# Patient Record
Sex: Female | Born: 1990
Health system: Southern US, Community
[De-identification: ages and names within clinical notes are randomized; demographics above are authoritative.]

## PROBLEM LIST (undated history)

## (undated) DIAGNOSIS — N6009 Solitary cyst of unspecified breast: Secondary | ICD-10-CM

## (undated) DIAGNOSIS — K259 Gastric ulcer, unspecified as acute or chronic, without hemorrhage or perforation: Secondary | ICD-10-CM

## (undated) DIAGNOSIS — J40 Bronchitis, not specified as acute or chronic: Secondary | ICD-10-CM

## (undated) DIAGNOSIS — N83209 Unspecified ovarian cyst, unspecified side: Secondary | ICD-10-CM

## (undated) DIAGNOSIS — B029 Zoster without complications: Secondary | ICD-10-CM

## (undated) DIAGNOSIS — D219 Benign neoplasm of connective and other soft tissue, unspecified: Secondary | ICD-10-CM

## (undated) DIAGNOSIS — N946 Dysmenorrhea, unspecified: Secondary | ICD-10-CM

## (undated) HISTORY — PX: RECTAL POLYPECTOMY: SHX2309

## (undated) HISTORY — DX: Dysmenorrhea, unspecified: N94.6

## (undated) HISTORY — PX: BREAST LUMPECTOMY: SHX2

## (undated) HISTORY — DX: Benign neoplasm of connective and other soft tissue, unspecified: D21.9

---
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
## 2010-03-01 ENCOUNTER — Emergency Department (HOSPITAL_BASED_OUTPATIENT_CLINIC_OR_DEPARTMENT_OTHER): Admission: EM | Admit: 2010-03-01 | Discharge: 2010-03-01 | Payer: Self-pay | Admitting: Emergency Medicine

## 2010-06-09 ENCOUNTER — Emergency Department (HOSPITAL_COMMUNITY)
Admission: EM | Admit: 2010-06-09 | Discharge: 2010-06-09 | Disposition: A | Discharge status: Home / Self Care | Payer: Self-pay | Attending: Emergency Medicine | Admitting: Emergency Medicine

## 2010-06-09 ENCOUNTER — Emergency Department (HOSPITAL_COMMUNITY): Payer: Self-pay

## 2010-06-09 DIAGNOSIS — N949 Unspecified condition associated with female genital organs and menstrual cycle: Secondary | ICD-10-CM | POA: Insufficient documentation

## 2010-06-09 DIAGNOSIS — N912 Amenorrhea, unspecified: Secondary | ICD-10-CM | POA: Insufficient documentation

## 2010-06-09 LAB — WET PREP, GENITAL
Trich, Wet Prep: NONE SEEN
Yeast Wet Prep HPF POC: NONE SEEN

## 2010-06-09 LAB — BASIC METABOLIC PANEL
Chloride: 105 mEq/L (ref 96–112)
GFR calc Af Amer: >60 mL/min (ref >60)
Potassium: 3.7 mEq/L (ref 3.5–5.1)

## 2010-06-09 LAB — DIFFERENTIAL
Basophils Relative: 0 % (ref 0–1)
Eosinophils Absolute: 0.2 10*3/uL (ref 0.0–0.7)
Lymphs Abs: 2.7 10*3/uL (ref 0.7–4.0)
Neutro Abs: 5.2 10*3/uL (ref 1.7–7.7)
Neutrophils Relative %: 61 % (ref 43–77)

## 2010-06-09 LAB — CBC
Platelets: 245 10*3/uL (ref 150–400)
RBC: 4.18 MIL/uL (ref 3.87–5.11)
WBC: 8.6 10*3/uL (ref 4.0–10.5)

## 2010-06-09 LAB — URINALYSIS, ROUTINE W REFLEX MICROSCOPIC
Bilirubin Urine: NEGATIVE
Hgb urine dipstick: NEGATIVE
Ketones, ur: NEGATIVE mg/dL
Protein, ur: NEGATIVE mg/dL
Specific Gravity, Urine: 1.02 (ref 1.005–1.030)
Urine Glucose, Fasting: NEGATIVE mg/dL
Urobilinogen, UA: 0.2 mg/dL (ref 0.0–1.0)
pH: 6.5 (ref 5.0–8.0)

## 2010-06-09 LAB — PREGNANCY, URINE: Preg Test, Ur: NEGATIVE

## 2010-06-09 LAB — LIPASE, BLOOD: Lipase: 23 U/L (ref 11–59)

## 2010-08-11 LAB — URINALYSIS, ROUTINE W REFLEX MICROSCOPIC
Bilirubin Urine: NEGATIVE
Glucose, UA: NEGATIVE mg/dL
Ketones, ur: NEGATIVE mg/dL
Nitrite: NEGATIVE
Protein, ur: 100 mg/dL — AB
Specific Gravity, Urine: 1.038 — ABNORMAL HIGH (ref 1.005–1.030)
Urobilinogen, UA: 1 mg/dL (ref 0.0–1.0)
pH: 6 (ref 5.0–8.0)

## 2010-08-11 LAB — URINE MICROSCOPIC-ADD ON

## 2010-08-11 LAB — PREGNANCY, URINE: Preg Test, Ur: NEGATIVE

## 2010-12-06 ENCOUNTER — Emergency Department (HOSPITAL_BASED_OUTPATIENT_CLINIC_OR_DEPARTMENT_OTHER)
Admission: EM | Admit: 2010-12-06 | Discharge: 2010-12-06 | Disposition: A | Discharge status: Home / Self Care | Payer: Self-pay | Attending: Emergency Medicine | Admitting: Emergency Medicine

## 2010-12-06 ENCOUNTER — Encounter: Payer: Self-pay | Admitting: Emergency Medicine

## 2010-12-06 ENCOUNTER — Emergency Department (INDEPENDENT_AMBULATORY_CARE_PROVIDER_SITE_OTHER): Payer: Self-pay

## 2010-12-06 DIAGNOSIS — S62609A Fracture of unspecified phalanx of unspecified finger, initial encounter for closed fracture: Secondary | ICD-10-CM | POA: Insufficient documentation

## 2010-12-06 DIAGNOSIS — X58XXXA Exposure to other specified factors, initial encounter: Secondary | ICD-10-CM

## 2010-12-06 DIAGNOSIS — IMO0002 Reserved for concepts with insufficient information to code with codable children: Secondary | ICD-10-CM

## 2010-12-06 MED ORDER — HYDROCODONE-ACETAMINOPHEN 5-325 MG PO TABS
2.0000 | ORAL_TABLET | Freq: Once | ORAL | Status: AC
  Administered 2010-12-06: 2 via ORAL
  Filled 2010-12-06: qty 2

## 2010-12-06 MED ORDER — HYDROCODONE-ACETAMINOPHEN 5-325 MG PO TABS: 2.0000 | ORAL_TABLET | ORAL | Status: AC | PRN

## 2010-12-06 NOTE — ED Notes (Signed)
Right middle finger injury occurred today.  Difficulty with movement, some swelling and pain at area.  Pulses present

## 2010-12-06 NOTE — ED Provider Notes (Signed)
Evaluation and management procedures were performed by the PA/NP under my supervision/collaboration.   Dione Booze, MD 12/06/10 [phone]
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
## 2010-12-23 ENCOUNTER — Encounter (HOSPITAL_BASED_OUTPATIENT_CLINIC_OR_DEPARTMENT_OTHER): Payer: Self-pay | Admitting: *Deleted

## 2010-12-23 ENCOUNTER — Emergency Department (HOSPITAL_BASED_OUTPATIENT_CLINIC_OR_DEPARTMENT_OTHER)
Admission: EM | Admit: 2010-12-23 | Discharge: 2010-12-23 | Disposition: A | Discharge status: Home / Self Care | Payer: Self-pay | Attending: Emergency Medicine | Admitting: Emergency Medicine

## 2010-12-23 DIAGNOSIS — B373 Candidiasis of vulva and vagina: Secondary | ICD-10-CM | POA: Insufficient documentation

## 2010-12-23 DIAGNOSIS — N760 Acute vaginitis: Secondary | ICD-10-CM | POA: Insufficient documentation

## 2010-12-23 DIAGNOSIS — F17200 Nicotine dependence, unspecified, uncomplicated: Secondary | ICD-10-CM | POA: Insufficient documentation

## 2010-12-23 DIAGNOSIS — A499 Bacterial infection, unspecified: Secondary | ICD-10-CM | POA: Insufficient documentation

## 2010-12-23 DIAGNOSIS — B3731 Acute candidiasis of vulva and vagina: Secondary | ICD-10-CM | POA: Insufficient documentation

## 2010-12-23 DIAGNOSIS — B9689 Other specified bacterial agents as the cause of diseases classified elsewhere: Secondary | ICD-10-CM | POA: Insufficient documentation

## 2010-12-23 LAB — URINALYSIS, ROUTINE W REFLEX MICROSCOPIC
Bilirubin Urine: NEGATIVE
Glucose, UA: NEGATIVE mg/dL
Hgb urine dipstick: NEGATIVE
Ketones, ur: NEGATIVE mg/dL
Nitrite: NEGATIVE
Protein, ur: NEGATIVE mg/dL
Specific Gravity, Urine: 1.029 (ref 1.005–1.030)
Urobilinogen, UA: 1 mg/dL (ref 0.0–1.0)
pH: 8 (ref 5.0–8.0)

## 2010-12-23 LAB — WET PREP, GENITAL: Trich, Wet Prep: NONE SEEN

## 2010-12-23 LAB — URINE MICROSCOPIC-ADD ON

## 2010-12-23 LAB — PREGNANCY, URINE: Preg Test, Ur: NEGATIVE

## 2010-12-23 MED ORDER — METRONIDAZOLE 500 MG PO TABS: 500.0000 mg | ORAL_TABLET | Freq: Two times a day (BID) | ORAL | Status: AC

## 2010-12-23 MED ORDER — FLUCONAZOLE 200 MG PO TABS: 200.0000 mg | ORAL_TABLET | Freq: Every day | ORAL | Status: AC

## 2010-12-23 NOTE — ED Notes (Signed)
Pt c/o white vaginal discharge and swelling x 1 week

## 2010-12-23 NOTE — ED Provider Notes (Signed)
History     CSN: [id number] Arrival date & time: 12/23/2010  1:27 PM  Chief Complaint  Patient presents with  . Vaginal Discharge   Patient is a 20 y.o. female presenting with vaginal discharge.  Vaginal Discharge This is a new problem. The current episode started 1 to 4 weeks ago. The problem occurs constantly. The problem has been gradually worsening. Associated symptoms include abdominal pain. Pertinent negatives include no fatigue, myalgias, nausea, urinary symptoms, vomiting or weakness. The symptoms are aggravated by nothing. She has tried nothing for the symptoms.  Pt complains of a thick white vaginal discharge for the past 2 weeks.    History reviewed. No pertinent past medical history.  Past Surgical History  Procedure Date  . Breast lumpectomy     History reviewed. No pertinent family history.  History  Substance Use Topics  . Smoking status: Current Some Day Smoker    Types: Cigarettes  . Smokeless tobacco: Not on file  . Alcohol Use: No    OB History    Grav Para Term Preterm Abortions TAB SAB Ect Mult Living                  Review of Systems  Constitutional: Negative for fatigue.  Gastrointestinal: Positive for abdominal pain. Negative for nausea and vomiting.  Genitourinary: Positive for vaginal discharge.  Musculoskeletal: Negative for myalgias.  Neurological: Negative for weakness.  All other systems reviewed and are negative.    Physical Exam  BP 123/64  Pulse 86  Temp(Src) 98.1 F (36.7 C) (Oral)  Resp 16  Wt 240 lb (108.863 kg)  SpO2 100%  LMP 11/10/2010  Physical Exam  Nursing note and vitals reviewed. Constitutional: She is oriented to person, place, and time. She appears well-developed and well-nourished.  HENT:  Head: Normocephalic.  Eyes: Pupils are equal, round, and reactive to light.  Neck: Normal range of motion.  Cardiovascular: Normal rate.   Pulmonary/Chest: Effort normal.  Abdominal: Soft.  Genitourinary:       Thick  white vaginal discharge,    Musculoskeletal: Normal range of motion.  Neurological: She is alert and oriented to person, place, and time.  Skin: Skin is warm.  Psychiatric: She has a normal mood and affect.    ED Course  Procedures  MDM Wet prep shows yeast and clue cells,  I will treat with flagyl and diflucan.      Langston Masker, [place] 12/23/10 1450

## 2010-12-23 NOTE — ED Provider Notes (Signed)
Evaluation and management procedures were performed by the mid-level provider (PA/NP/CNM) under my supervision/collaboration. I was present and available during the ED course. Darin Redmann Y.   Gavin Pound. Charlisa Cham, MD 12/23/10 1455

## 2011-01-27 LAB — RAPID STREP SCREEN (MED CTR MEBANE ONLY): Streptococcus, Group A Screen (Direct): NEGATIVE

## 2011-02-25 ENCOUNTER — Emergency Department (HOSPITAL_BASED_OUTPATIENT_CLINIC_OR_DEPARTMENT_OTHER)
Admission: EM | Admit: 2011-02-25 | Discharge: 2011-02-25 | Disposition: A | Discharge status: Home / Self Care | Payer: Self-pay | Attending: Emergency Medicine | Admitting: Emergency Medicine

## 2011-02-25 ENCOUNTER — Encounter (HOSPITAL_BASED_OUTPATIENT_CLINIC_OR_DEPARTMENT_OTHER): Payer: Self-pay | Admitting: *Deleted

## 2011-02-25 ENCOUNTER — Emergency Department (INDEPENDENT_AMBULATORY_CARE_PROVIDER_SITE_OTHER): Payer: Self-pay

## 2011-02-25 DIAGNOSIS — B9789 Other viral agents as the cause of diseases classified elsewhere: Secondary | ICD-10-CM | POA: Insufficient documentation

## 2011-02-25 DIAGNOSIS — B349 Viral infection, unspecified: Secondary | ICD-10-CM

## 2011-02-25 DIAGNOSIS — F17200 Nicotine dependence, unspecified, uncomplicated: Secondary | ICD-10-CM | POA: Insufficient documentation

## 2011-02-25 DIAGNOSIS — R05 Cough: Secondary | ICD-10-CM

## 2011-02-25 MED ORDER — HYDROCOD POLST-CHLORPHEN POLST 10-8 MG/5ML PO LQCR: 5.0000 mL | Freq: Two times a day (BID) | ORAL | Status: DC | PRN

## 2011-02-25 NOTE — ED Provider Notes (Signed)
History     CSN: [id number] Arrival date & time: 02/25/2011  3:05 PM   First MD Initiated Contact with Patient 02/25/11 1606      Chief Complaint  Patient presents with  . URI    (Consider location/radiation/quality/duration/timing/severity/associated sxs/prior treatment) Patient is a 20 y.o. female presenting with URI. The history is provided by the patient. No language interpreter was used.  URI The primary symptoms include sore throat, cough and myalgias. Primary symptoms do not include fever, headaches, nausea or vomiting. The current episode started yesterday. This is a new problem. The problem has not changed since onset. Symptoms associated with the illness include chills and congestion.    History reviewed. No pertinent past medical history.  Past Surgical History  Procedure Date  . Breast lumpectomy     History reviewed. No pertinent family history.  History  Substance Use Topics  . Smoking status: Current Some Day Smoker    Types: Cigarettes  . Smokeless tobacco: Not on file  . Alcohol Use: No    OB History    Grav Para Term Preterm Abortions TAB SAB Ect Mult Living                  Review of Systems  Constitutional: Positive for chills. Negative for fever.  HENT: Positive for congestion and sore throat.   Respiratory: Positive for cough.   Gastrointestinal: Negative for nausea and vomiting.  Musculoskeletal: Positive for myalgias.  Neurological: Negative for headaches.  All other systems reviewed and are negative.    Allergies  Review of patient's allergies indicates no known allergies.  Home Medications  No current outpatient prescriptions on file.  BP 118/70  Pulse 89  Temp(Src) 98.7 F (37.1 C) (Oral)  Resp 18  Ht 5\' 7"  (1.702 m)  Wt 220 lb (99.791 kg)  BMI 34.46 kg/m2  SpO2 98%  LMP 02/25/2011  Physical Exam  Nursing note and vitals reviewed. Constitutional: She is oriented to person, place, and time. She appears well-developed  and well-nourished.  HENT:  Right Ear: External ear normal.  Left Ear: External ear normal.  Nose: Rhinorrhea present.  Mouth/Throat: Posterior oropharyngeal erythema present.  Cardiovascular: Normal rate and regular rhythm.   Pulmonary/Chest: Effort normal. She has rales.  Musculoskeletal: Normal range of motion.  Neurological: She is alert and oriented to person, place, and time.  Skin: Skin is warm and dry.    ED Course  Procedures (including critical care time)  Labs Reviewed - No data to display Dg Chest 2 View  02/25/2011  *RADIOLOGY REPORT*  Clinical Data: Cough  CHEST - 2 VIEW  Comparison: 09/25/2008  Findings: The heart size and mediastinal contours are within normal limits.  Both lungs are clear.  The visualized skeletal structures are unremarkable.  IMPRESSION: No active disease.  Original Report Authenticated By: Rosealee Albee, M.D.     1. Viral illness       MDM  No acute finding noted on x-ray:will treat symptomatically  Medical screening examination/treatment/procedure(s) were performed by non-physician practitioner and as supervising physician I was immediately available for consultation/collaboration. Osvaldo Human, M.D.       Teressa Lower, NP 02/25/11 1709  Teressa Lower, NP 02/25/11 1709  Carleene Cooper III, MD 02/25/11 [phone]

## 2011-02-25 NOTE — ED Notes (Signed)
Pt c/o cold like symptoms x 1 day

## 2011-05-19 ENCOUNTER — Encounter (HOSPITAL_COMMUNITY): Payer: Self-pay | Admitting: General Practice

## 2011-05-19 ENCOUNTER — Encounter (HOSPITAL_BASED_OUTPATIENT_CLINIC_OR_DEPARTMENT_OTHER): Payer: Self-pay

## 2011-05-19 ENCOUNTER — Emergency Department (HOSPITAL_COMMUNITY)
Admission: EM | Admit: 2011-05-19 | Discharge: 2011-05-19 | Disposition: A | Discharge status: Home / Self Care | Payer: Self-pay | Attending: Emergency Medicine | Admitting: Emergency Medicine

## 2011-05-19 ENCOUNTER — Emergency Department (HOSPITAL_BASED_OUTPATIENT_CLINIC_OR_DEPARTMENT_OTHER)
Admission: EM | Admit: 2011-05-19 | Discharge: 2011-05-19 | Disposition: A | Discharge status: Home / Self Care | Payer: Self-pay | Attending: Emergency Medicine | Admitting: Emergency Medicine

## 2011-05-19 DIAGNOSIS — M549 Dorsalgia, unspecified: Secondary | ICD-10-CM | POA: Insufficient documentation

## 2011-05-19 DIAGNOSIS — R109 Unspecified abdominal pain: Secondary | ICD-10-CM | POA: Insufficient documentation

## 2011-05-19 DIAGNOSIS — F17200 Nicotine dependence, unspecified, uncomplicated: Secondary | ICD-10-CM | POA: Insufficient documentation

## 2011-05-19 DIAGNOSIS — B029 Zoster without complications: Secondary | ICD-10-CM | POA: Insufficient documentation

## 2011-05-19 HISTORY — DX: Bronchitis, not specified as acute or chronic: J40

## 2011-05-19 HISTORY — DX: Unspecified ovarian cyst, unspecified side: N83.209

## 2011-05-19 LAB — URINALYSIS, ROUTINE W REFLEX MICROSCOPIC
Bilirubin Urine: NEGATIVE
Glucose, UA: NEGATIVE mg/dL
Specific Gravity, Urine: 1.025 (ref 1.005–1.030)
pH: 6.5 (ref 5.0–8.0)

## 2011-05-19 LAB — URINE MICROSCOPIC-ADD ON

## 2011-05-19 LAB — DIFFERENTIAL
Basophils Relative: 0 % (ref 0–1)
Eosinophils Absolute: 0.2 10*3/uL (ref 0.0–0.7)
Lymphocytes Relative: 30 % (ref 12–46)
Lymphs Abs: 3.1 10*3/uL (ref 0.7–4.0)
Monocytes Relative: 6 % (ref 3–12)
Neutrophils Relative %: 62 % (ref 43–77)

## 2011-05-19 LAB — CBC
Hemoglobin: 11.9 g/dL — ABNORMAL LOW (ref 12.0–15.0)
MCH: 28.8 pg (ref 26.0–34.0)
MCHC: 32.9 g/dL (ref 30.0–36.0)
Platelets: 249 10*3/uL (ref 150–400)
RBC: 4.13 MIL/uL (ref 3.87–5.11)
WBC: 10.1 10*3/uL (ref 4.0–10.5)

## 2011-05-19 MED ORDER — FENTANYL CITRATE 0.05 MG/ML IJ SOLN
50.0000 ug | Freq: Once | INTRAMUSCULAR | Status: AC
  Administered 2011-05-19: 50 ug via INTRAMUSCULAR
  Filled 2011-05-19: qty 2

## 2011-05-19 MED ORDER — ACYCLOVIR 400 MG PO TABS: 800.0000 mg | ORAL_TABLET | ORAL | Status: AC

## 2011-05-19 MED ORDER — KETOROLAC TROMETHAMINE 30 MG/ML IJ SOLN
60.0000 mg | Freq: Once | INTRAMUSCULAR | Status: AC
  Administered 2011-05-19: 60 mg via INTRAMUSCULAR
  Filled 2011-05-19: qty 2

## 2011-05-19 MED ORDER — OXYCODONE-ACETAMINOPHEN 7.5-325 MG PO TABS: 1.0000 | ORAL_TABLET | ORAL | Status: AC | PRN

## 2011-05-19 NOTE — ED Notes (Signed)
Pt's mother reports she was seen at [facility] today and dx with shingles. States they wanted her to stay in hospital but pt refused and now mother wants her seen again in case being admitted is definitely needed. States she did not have rx filled for acyclovir or percocet.

## 2011-05-19 NOTE — ED Notes (Signed)
Patient states that she developed shingles last Saturday. Today she saw a physician who wanted to admit her as an in-patient at another facility. Patient did not wish to be admitted and was sent home with prescriptions for pain and an anti-viral for shingles. Patient c/o increased pain and her mother took her to [facility] ER to be seen.

## 2011-05-19 NOTE — ED Provider Notes (Signed)
History     CSN: [id number]  Arrival date & time 05/19/11  4098   First MD Initiated Contact with Patient 05/19/11 [phone]      Chief Complaint  Patient presents with  . Abdominal Pain  . Back Pain  . Rash     HPI Pt reports lower abdominal pain radiating to lower back since Saturday. She reports a whitish vaginal d/c and pain with urination x 2-3 weeks.  Patient reports to three-day history of a rash which started on her low back area.  She says it's tender to touch the skin.  Patient denies any fever.  Past Medical History  Diagnosis Date  . Ovarian cyst     Past Surgical History  Procedure Date  . Breast lumpectomy     No family history on file.  History  Substance Use Topics  . Smoking status: Current Some Day Smoker    Types: Cigarettes  . Smokeless tobacco: Not on file  . Alcohol Use: Yes     occasional    OB History    Grav Para Term Preterm Abortions TAB SAB Ect Mult Living                  Review of Systems Remaining review of systems negative except as noted in history of present illness Allergies  Review of patient's allergies indicates no known allergies.  Home Medications   Current Outpatient Rx  Name Route Sig Dispense Refill  . ACYCLOVIR 400 MG PO TABS Oral Take 2 tablets (800 mg total) by mouth every 4 (four) hours while awake. 50 tablet 0  . HYDROCOD POLST-CPM POLST ER 10-8 MG/5ML PO LQCR Oral Take 5 mLs by mouth every 12 (twelve) hours as needed. 140 mL 0  . OXYCODONE-ACETAMINOPHEN 7.5-325 MG PO TABS Oral Take 1 tablet by mouth every 4 (four) hours as needed for pain. 30 tablet 0    BP 125/78  Pulse 82  Temp(Src) 98.2 F (36.8 C) (Oral)  Resp 16  Ht 5\' 5"  (1.651 m)  Wt 220 lb (99.791 kg)  BMI 36.61 kg/m2  SpO2 100%  LMP 04/23/2011  Physical Exam  Nursing note and vitals reviewed. Constitutional: She is oriented to person, place, and time. She appears well-developed and well-nourished. No distress.  HENT:  Head: Normocephalic  and atraumatic.  Eyes: Pupils are equal, round, and reactive to light.  Neck: Normal range of motion.  Cardiovascular: Normal rate and intact distal pulses.   Pulmonary/Chest: No respiratory distress.  Abdominal: Normal appearance. She exhibits no distension.  Musculoskeletal: Normal range of motion.  Neurological: She is alert and oriented to person, place, and time. No cranial nerve deficit.  Skin: Skin is warm and dry. Rash noted.     Psychiatric: She has a normal mood and affect. Her behavior is normal.    ED Course  Procedures (including critical care time)  Labs Reviewed  URINALYSIS, ROUTINE W REFLEX MICROSCOPIC - Abnormal; Notable for the following:    Hgb urine dipstick TRACE (*)    All other components within normal limits  CBC - Abnormal; Notable for the following:    Hemoglobin 11.9 (*)    All other components within normal limits  URINE MICROSCOPIC-ADD ON - Abnormal; Notable for the following:    Squamous Epithelial / LPF FEW (*)    Bacteria, UA FEW (*)    All other components within normal limits  PREGNANCY, URINE  DIFFERENTIAL   No results found.   1. Herpes zoster  MDM         Nelia Shi, MD 05/19/11 1053

## 2011-05-19 NOTE — ED Provider Notes (Signed)
History     CSN: [id number]  Arrival date & time 05/19/11  1327   First MD Initiated Contact with Patient 05/19/11 1501      Chief Complaint  Patient presents with  . Herpes Zoster    (Consider location/radiation/quality/duration/timing/severity/associated sxs/prior treatment) HPI  Patient presents to ER complaining of a painful rash x 4 days. Patient was seen at [facility] ER earlier today and dx with herpes zoster and given a prescription for acyclovir and narcotic pain medication but returns to [facility] ER with her mother with concern of ongoing pain and question of whether or not she should be admitted to hospital bc of pain. Patient states pain is burning and radiating around mid back into right flank and around to mid abdomen and is constant. Patient has not filled prescriptions or taken anything for pain since being d/c from ER earlier today. Mother and patient deny any known active medical problems and the patient states she takes no medication on a regular basis. Patient states that over the last few days she has felt generally weak and light headed especially when she goes from sitting to standing. Denies known fevers, chills, HA, neck pain, visual changes, n/v/d.   Past Medical History  Diagnosis Date  . Ovarian cyst   . Bronchitis     Past Surgical History  Procedure Date  . Breast lumpectomy     No family history on file.  History  Substance Use Topics  . Smoking status: Current Some Day Smoker    Types: Cigarettes  . Smokeless tobacco: Not on file  . Alcohol Use: Yes     occasional    OB History    Grav Para Term Preterm Abortions TAB SAB Ect Mult Living                  Review of Systems  All other systems reviewed and are negative.    Allergies  Review of patient's allergies indicates no known allergies.  Home Medications   Current Outpatient Rx  Name Route Sig Dispense Refill  . IBUPROFEN 200 MG PO TABS Oral Take 800 mg by mouth every 6 (six)  hours as needed. pain    . ACYCLOVIR 400 MG PO TABS Oral Take 2 tablets (800 mg total) by mouth every 4 (four) hours while awake. 50 tablet 0  . OXYCODONE-ACETAMINOPHEN 7.5-325 MG PO TABS Oral Take 1 tablet by mouth every 4 (four) hours as needed for pain. 30 tablet 0    BP 116/77  Pulse 81  Temp(Src) 98.5 F (36.9 C) (Oral)  Resp 16  SpO2 99%  LMP 04/23/2011  Physical Exam  Nursing note and vitals reviewed. Constitutional: She is oriented to person, place, and time. She appears well-developed and well-nourished. No distress.  HENT:  Head: Normocephalic and atraumatic.  Eyes: Conjunctivae and EOM are normal. Pupils are equal, round, and reactive to light.  Neck: Normal range of motion. Neck supple.  Cardiovascular: Normal rate, regular rhythm, normal heart sounds and intact distal pulses.  Exam reveals no gallop and no friction rub.   No murmur heard. Pulmonary/Chest: Effort normal and breath sounds normal. No respiratory distress. She has no wheezes. She has no rales. She exhibits no tenderness.  Abdominal: Soft. Bowel sounds are normal. She exhibits no distension and no mass. There is no tenderness. There is no rebound and no guarding.  Musculoskeletal: Normal range of motion. She exhibits no edema and no tenderness.  Neurological: She is alert and oriented  to person, place, and time. No cranial nerve deficit.  Skin: Skin is warm and dry. Rash noted. She is not diaphoretic. No erythema.       Papular, vesicular rash left of midline of mid back radiating in linear fashion.   Psychiatric: She has a normal mood and affect.    ED Course  Procedures (including critical care time)  IM fentanyl and toradol.  Labs Reviewed - No data to display No results found.   1. Herpes zoster       MDM  Patient is afebrile and non toxic appearing and is immunocompetent and appropriate for out patient management of herpes zoster.   Patient states pain has improved after toradol and  fentanyl. No changes with orthostatics and ambulating without difficulty.        Jenness Corner, [place] 05/19/11 1622

## 2011-05-19 NOTE — ED Notes (Signed)
Pt reports lower abdominal pain radiating to lower back since Saturday. She reports a whitish vaginal d/c and pain with urination x 2-3 weeks.

## 2011-05-19 NOTE — ED Notes (Signed)
MD at bedside. 

## 2011-05-20 NOTE — ED Provider Notes (Signed)
Medical screening examination/treatment/procedure(s) were performed by non-physician practitioner and as supervising physician I was immediately available for consultation/collaboration.  Chrishonda Hesch, MD 05/20/11 0001 

## 2011-05-22 ENCOUNTER — Encounter (HOSPITAL_BASED_OUTPATIENT_CLINIC_OR_DEPARTMENT_OTHER): Payer: Self-pay | Admitting: *Deleted

## 2011-05-22 ENCOUNTER — Emergency Department (HOSPITAL_BASED_OUTPATIENT_CLINIC_OR_DEPARTMENT_OTHER)
Admission: EM | Admit: 2011-05-22 | Discharge: 2011-05-22 | Disposition: A | Discharge status: Home / Self Care | Payer: Self-pay | Attending: Emergency Medicine | Admitting: Emergency Medicine

## 2011-05-22 DIAGNOSIS — R209 Unspecified disturbances of skin sensation: Secondary | ICD-10-CM | POA: Insufficient documentation

## 2011-05-22 DIAGNOSIS — B029 Zoster without complications: Secondary | ICD-10-CM | POA: Insufficient documentation

## 2011-05-22 DIAGNOSIS — F17200 Nicotine dependence, unspecified, uncomplicated: Secondary | ICD-10-CM | POA: Insufficient documentation

## 2011-05-22 DIAGNOSIS — M549 Dorsalgia, unspecified: Secondary | ICD-10-CM | POA: Insufficient documentation

## 2011-05-22 MED ORDER — HYDROCODONE-IBUPROFEN 7.5-200 MG PO TABS: ORAL_TABLET | ORAL | Status: DC

## 2011-05-22 MED ORDER — ONDANSETRON HCL 4 MG/2ML IJ SOLN
4.0000 mg | Freq: Once | INTRAMUSCULAR | Status: AC
  Administered 2011-05-22: 4 mg via INTRAMUSCULAR
  Filled 2011-05-22: qty 2

## 2011-05-22 MED ORDER — HYDROMORPHONE HCL PF 2 MG/ML IJ SOLN
2.0000 mg | Freq: Once | INTRAMUSCULAR | Status: AC
  Administered 2011-05-22: 2 mg via INTRAMUSCULAR
  Filled 2011-05-22: qty 1

## 2011-05-22 MED ORDER — OXYCODONE-ACETAMINOPHEN 5-325 MG PO TABS: 2.0000 | ORAL_TABLET | Freq: Once | ORAL | Status: DC

## 2011-05-22 MED ORDER — HYDROCODONE-IBUPROFEN 7.5-200 MG PO TABS: 2.0000 | ORAL_TABLET | Freq: Four times a day (QID) | ORAL | Status: DC | PRN

## 2011-05-22 MED ORDER — PREDNISONE 10 MG PO TABS: ORAL_TABLET | ORAL | Status: DC

## 2011-05-22 MED ORDER — PREDNISONE 50 MG PO TABS: 60.0000 mg | ORAL_TABLET | Freq: Every day | ORAL | Status: DC

## 2011-05-22 NOTE — ED Provider Notes (Signed)
History     CSN: [id number]  Arrival date & time 05/22/11  2128   First MD Initiated Contact with Patient 05/22/11 2227      Chief Complaint  Patient presents with  . Herpes Zoster    (Consider location/radiation/quality/duration/timing/severity/associated sxs/prior treatment) Patient is a 21 y.o. female presenting with rash. The history is provided by the patient. No language interpreter was used.  Rash  This is a new problem. The current episode started more than 2 days ago. The problem has not changed since onset.Associated with: shingles. There has been no fever. The rash is present on the torso and back. The pain is at a severity of 8/10. The pain is severe. The pain has been constant since onset. Associated symptoms include blisters. She has tried a cold compress for the symptoms. The treatment provided no relief.  Pt reports she was diagnosed with shingles 3 days ago.  Pt complains of pain full body.  Pt reports she hurts all over.  No relief with percocet  Past Medical History  Diagnosis Date  . Ovarian cyst   . Bronchitis     Past Surgical History  Procedure Date  . Breast lumpectomy     History reviewed. No pertinent family history.  History  Substance Use Topics  . Smoking status: Current Some Day Smoker    Types: Cigarettes  . Smokeless tobacco: Not on file  . Alcohol Use: Yes     occasional    OB History    Grav Para Term Preterm Abortions TAB SAB Ect Mult Living                  Review of Systems  Musculoskeletal: Positive for back pain.  Skin: Positive for rash.  All other systems reviewed and are negative.    Allergies  Review of patient's allergies indicates no known allergies.  Home Medications   Current Outpatient Rx  Name Route Sig Dispense Refill  . ACYCLOVIR 400 MG PO TABS Oral Take 2 tablets (800 mg total) by mouth every 4 (four) hours while awake. 50 tablet 0  . OXYCODONE-ACETAMINOPHEN 7.5-325 MG PO TABS Oral Take 1 tablet by  mouth every 4 (four) hours as needed for pain. 30 tablet 0    BP 134/80  Pulse 80  Temp(Src) 98.6 F (37 C) (Oral)  Resp 20  Ht 5\' 6"  (1.676 m)  Wt 230 lb (104.327 kg)  BMI 37.12 kg/m2  SpO2 100%  LMP 04/23/2011  Physical Exam  Nursing note and vitals reviewed. Constitutional: She is oriented to person, place, and time. She appears well-developed and well-nourished.  HENT:  Head: Normocephalic and atraumatic.  Eyes: Pupils are equal, round, and reactive to light.  Neck: Normal range of motion.  Cardiovascular: Normal rate.   Pulmonary/Chest: Effort normal.  Abdominal: Soft.  Musculoskeletal: She exhibits tenderness.       Clearing rash left back,    Neurological: She is alert and oriented to person, place, and time.  Skin: Rash noted.  Psychiatric: She has a normal mood and affect.    ED Course  Procedures (including critical care time)  Labs Reviewed - No data to display No results found.   No diagnosis found.    MDM   Pt given shot of dilaudid and zofran.   Pt advised to see her Md for recheck next week       Langston Masker, [place] 05/22/11 2255

## 2011-05-22 NOTE — ED Notes (Signed)
Pt seen here and diagnosed with shingles on the 23rd. Placed on Acyclovir and pain meds without relief. Tearful at triage.

## 2011-05-22 NOTE — ED Provider Notes (Signed)
Medical screening examination/treatment/procedure(s) were performed by non-physician practitioner and as supervising physician I was immediately available for consultation/collaboration.   Forbes Cellar, MD 05/22/11 [phone]

## 2011-05-25 NOTE — ED Notes (Signed)
rec'd call from pt requesting work excuse to be extended through the rest of this week due to her continued pain. Spoke with K. [place], [place] who stated that pt is no longer contagious and is free to return to work. Informed pt that she would not be receiving another work excuse. Pt agreed with plan of care. Rec'd another call from pt's mother regarding a work note for her daughter. Informed mother that I could not discuss her daughter's care with her due to it being a HIPPA violation. Pt then got back on the phone requesting an extension of her work note. Reviewed the previous information with pt once again and instructed her to take ibuprofen for pain and to only take narcotics if she was staying at home. Pt agrees with plan of care but is not happy with the service here since we would not extend her work note for the rest of the week. Pt informed that she could follow up with a PMD for further concerns or return here to be re-evaluated.

## 2011-07-01 ENCOUNTER — Emergency Department (INDEPENDENT_AMBULATORY_CARE_PROVIDER_SITE_OTHER): Payer: Self-pay

## 2011-07-01 ENCOUNTER — Emergency Department (HOSPITAL_BASED_OUTPATIENT_CLINIC_OR_DEPARTMENT_OTHER)
Admission: EM | Admit: 2011-07-01 | Discharge: 2011-07-01 | Disposition: A | Discharge status: Home / Self Care | Payer: Self-pay | Attending: Emergency Medicine | Admitting: Emergency Medicine

## 2011-07-01 ENCOUNTER — Encounter (HOSPITAL_BASED_OUTPATIENT_CLINIC_OR_DEPARTMENT_OTHER): Payer: Self-pay | Admitting: *Deleted

## 2011-07-01 DIAGNOSIS — S0990XA Unspecified injury of head, initial encounter: Secondary | ICD-10-CM | POA: Insufficient documentation

## 2011-07-01 DIAGNOSIS — M542 Cervicalgia: Secondary | ICD-10-CM | POA: Insufficient documentation

## 2011-07-01 DIAGNOSIS — R51 Headache: Secondary | ICD-10-CM | POA: Insufficient documentation

## 2011-07-01 DIAGNOSIS — Z043 Encounter for examination and observation following other accident: Secondary | ICD-10-CM

## 2011-07-01 DIAGNOSIS — R079 Chest pain, unspecified: Secondary | ICD-10-CM | POA: Insufficient documentation

## 2011-07-01 DIAGNOSIS — S161XXA Strain of muscle, fascia and tendon at neck level, initial encounter: Secondary | ICD-10-CM

## 2011-07-01 DIAGNOSIS — S139XXA Sprain of joints and ligaments of unspecified parts of neck, initial encounter: Secondary | ICD-10-CM | POA: Insufficient documentation

## 2011-07-01 HISTORY — DX: Zoster without complications: B02.9

## 2011-07-01 MED ORDER — OXYCODONE-ACETAMINOPHEN 5-325 MG PO TABS
1.0000 | ORAL_TABLET | Freq: Once | ORAL | Status: AC
  Administered 2011-07-01: 1 via ORAL
  Filled 2011-07-01: qty 1

## 2011-07-01 NOTE — ED Notes (Signed)
No bruises on the head and no seatbelt markings on Pt. Chest.  Pt. Reports she just got over the shingles about a month ago.  Pt. Reports she is sore all over.
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
## 2012-03-07 ENCOUNTER — Encounter (HOSPITAL_COMMUNITY): Payer: Self-pay | Admitting: Family

## 2012-03-07 ENCOUNTER — Inpatient Hospital Stay (HOSPITAL_COMMUNITY)
Admission: AD | Admit: 2012-03-07 | Discharge: 2012-03-07 | Disposition: A | Discharge status: Home / Self Care | Payer: Self-pay | Source: Ambulatory Visit | Attending: Obstetrics & Gynecology | Admitting: Obstetrics & Gynecology

## 2012-03-07 ENCOUNTER — Inpatient Hospital Stay (HOSPITAL_COMMUNITY): Payer: Self-pay

## 2012-03-07 DIAGNOSIS — N926 Irregular menstruation, unspecified: Secondary | ICD-10-CM | POA: Insufficient documentation

## 2012-03-07 DIAGNOSIS — D259 Leiomyoma of uterus, unspecified: Secondary | ICD-10-CM

## 2012-03-07 DIAGNOSIS — N83209 Unspecified ovarian cyst, unspecified side: Secondary | ICD-10-CM | POA: Insufficient documentation

## 2012-03-07 DIAGNOSIS — N949 Unspecified condition associated with female genital organs and menstrual cycle: Secondary | ICD-10-CM | POA: Insufficient documentation

## 2012-03-07 DIAGNOSIS — N946 Dysmenorrhea, unspecified: Secondary | ICD-10-CM

## 2012-03-07 HISTORY — DX: Solitary cyst of unspecified breast: N60.09

## 2012-03-07 LAB — URINALYSIS, ROUTINE W REFLEX MICROSCOPIC
Ketones, ur: NEGATIVE mg/dL
Leukocytes, UA: NEGATIVE
Nitrite: NEGATIVE
Protein, ur: NEGATIVE mg/dL
Urobilinogen, UA: 0.2 mg/dL (ref 0.0–1.0)

## 2012-03-07 LAB — CBC WITH DIFFERENTIAL/PLATELET
Basophils Absolute: 0 10*3/uL (ref 0.0–0.1)
HCT: 35.6 % — ABNORMAL LOW (ref 36.0–46.0)
Hemoglobin: 11.8 g/dL — ABNORMAL LOW (ref 12.0–15.0)
Lymphocytes Relative: 32 % (ref 12–46)
Lymphs Abs: 3.4 10*3/uL (ref 0.7–4.0)
MCV: 88.1 fL (ref 78.0–100.0)
Monocytes Absolute: 0.6 10*3/uL (ref 0.1–1.0)
Neutro Abs: 6.5 10*3/uL (ref 1.7–7.7)
RBC: 4.04 MIL/uL (ref 3.87–5.11)
RDW: 13.1 % (ref 11.5–15.5)
WBC: 10.7 10*3/uL — ABNORMAL HIGH (ref 4.0–10.5)

## 2012-03-07 LAB — WET PREP, GENITAL: Yeast Wet Prep HPF POC: NONE SEEN

## 2012-03-07 LAB — POCT PREGNANCY, URINE: Preg Test, Ur: NEGATIVE

## 2012-03-07 MED ORDER — NAPROXEN SODIUM 550 MG PO TABS: 550.0000 mg | ORAL_TABLET | Freq: Two times a day (BID) | ORAL | Status: DC

## 2012-03-07 MED ORDER — KETOROLAC TROMETHAMINE 60 MG/2ML IM SOLN
60.0000 mg | Freq: Once | INTRAMUSCULAR | Status: AC
  Administered 2012-03-07: 60 mg via INTRAMUSCULAR
  Filled 2012-03-07: qty 2

## 2012-03-07 NOTE — MAU Note (Signed)
Also has tampon she can't get out, pt's mother thinks its r/t pt's pain.

## 2012-03-07 NOTE — MAU Provider Note (Signed)
History     CSN: [id number]  Arrival date and time: 03/07/12 1143   First Provider Initiated Contact with Patient 03/07/12 1310      No chief complaint on file.  HPI Laura Vargas is a 21 y.o. female who presents to MAU with abdominal pain. The pain started this morning. Is similar to pain with periods. Started period last night. Rates her pain as 10/10. Nothing makes the pain better. Sitting up straight makes it worse. Has not taken anything for pain. Has taken Aleve in the past for period pain and it helps but this pain seemed worse than usual. Associated symptoms include nausea and headache. Last pap smear 6 months ago and was abnormal but has not had follow up. No history of STI's. Sexually active, current partner x 1 month. Condoms for birth control. The history was provided by the patient.  OB History    Grav Para Term Preterm Abortions TAB SAB Ect Mult Living   0               Past Medical History  Diagnosis Date  . Ovarian cyst   . Bronchitis   . Shingles   . Breast cyst     Past Surgical History  Procedure Date  . Breast lumpectomy     No family history on file.  History  Substance Use Topics  . Smoking status: Current Some Day Smoker    Types: Cigarettes  . Smokeless tobacco: Not on file  . Alcohol Use: Yes     Comment: occasional    Allergies: No Known Allergies  Prescriptions prior to admission  Medication Sig Dispense Refill  . naproxen sodium (ANAPROX) 220 MG tablet Take 220 mg by mouth 2 (two) times daily with a meal. Pain/cramps        Review of Systems  Constitutional: Negative for fever, chills and weight loss.  HENT: Negative for ear pain, nosebleeds, congestion, sore throat and neck pain.   Eyes: Negative for blurred vision, double vision, photophobia and pain.  Respiratory: Negative for cough, shortness of breath and wheezing.   Cardiovascular: Negative for chest pain, palpitations and leg swelling.  Gastrointestinal: Positive for  nausea, abdominal pain and constipation. Negative for heartburn, vomiting and diarrhea.  Genitourinary: Positive for frequency. Negative for dysuria and urgency.  Musculoskeletal: Positive for back pain. Negative for myalgias.  Skin: Negative for itching and rash.  Neurological: Positive for headaches. Negative for dizziness, sensory change, speech change, seizures and weakness.  Endo/Heme/Allergies: Does not bruise/bleed easily.  Psychiatric/Behavioral: Negative for depression. The patient is not nervous/anxious and does not have insomnia.    Physical Exam   Blood pressure 119/54, pulse 86, temperature 97.7 F (36.5 C), temperature source Oral, resp. rate 20, height 5\' 6"  (1.676 m), weight 239 lb 8 oz (108.636 kg), last menstrual period 03/06/2012.  Physical Exam  Nursing note and vitals reviewed. Constitutional: She is oriented to person, place, and time. No distress.       obese  HENT:  Head: Normocephalic and atraumatic.  Eyes: EOM are normal.  Neck: Neck supple.  Cardiovascular: Normal rate.   Respiratory: Effort normal.  GI: Soft. There is tenderness in the right lower quadrant, suprapubic area and left lower quadrant. There is no rigidity, no rebound, no guarding and no CVA tenderness.  Genitourinary:       External genitalia without lesions. Moderate blood vaginal vault. Positive CMT, bilateral adnexal tenderness. Unable to palpate uterus due to patient habitus.   Musculoskeletal: Normal  range of motion.  Neurological: She is alert and oriented to person, place, and time.  Skin: Skin is warm and dry.  Psychiatric: She has a normal mood and affect. Her behavior is normal. Judgment and thought content normal.   Results for orders placed during the hospital encounter of 03/07/12 (from the past 24 hour(s))  URINALYSIS, ROUTINE W REFLEX MICROSCOPIC     Status: Abnormal   Collection Time   03/07/12 12:20 PM      Component Value Range   Color, Urine YELLOW  YELLOW   APPearance  HAZY (*) CLEAR   Specific Gravity, Urine >1.030 (*) 1.005 - 1.030   pH 5.5  5.0 - 8.0   Glucose, UA NEGATIVE  NEGATIVE mg/dL   Hgb urine dipstick LARGE (*) NEGATIVE   Bilirubin Urine NEGATIVE  NEGATIVE   Ketones, ur NEGATIVE  NEGATIVE mg/dL   Protein, ur NEGATIVE  NEGATIVE mg/dL   Urobilinogen, UA 0.2  0.0 - 1.0 mg/dL   Nitrite NEGATIVE  NEGATIVE   Leukocytes, UA NEGATIVE  NEGATIVE  URINE MICROSCOPIC-ADD ON     Status: Normal   Collection Time   03/07/12 12:20 PM      Component Value Range   Squamous Epithelial / LPF RARE  RARE   RBC / HPF 21-50  <3 RBC/hpf  POCT PREGNANCY, URINE     Status: Normal   Collection Time   03/07/12 12:30 PM      Component Value Range   Preg Test, Ur NEGATIVE  NEGATIVE  CBC WITH DIFFERENTIAL     Status: Abnormal   Collection Time   03/07/12  1:35 PM      Component Value Range   WBC 10.7 (*) 4.0 - 10.5 K/uL   RBC 4.04  3.87 - 5.11 MIL/uL   Hemoglobin 11.8 (*) 12.0 - 15.0 g/dL   HCT 16.1 (*) 09.6 - 04.5 %   MCV 88.1  78.0 - 100.0 fL   MCH 29.2  26.0 - 34.0 pg   MCHC 33.1  30.0 - 36.0 g/dL   RDW 40.9  81.1 - 91.4 %   Platelets 272  150 - 400 K/uL   Neutrophils Relative 61  43 - 77 %   Neutro Abs 6.5  1.7 - 7.7 K/uL   Lymphocytes Relative 32  12 - 46 %   Lymphs Abs 3.4  0.7 - 4.0 K/uL   Monocytes Relative 6  3 - 12 %   Monocytes Absolute 0.6  0.1 - 1.0 K/uL   Eosinophils Relative 2  0 - 5 %   Eosinophils Absolute 0.2  0.0 - 0.7 K/uL   Basophils Relative 0  0 - 1 %   Basophils Absolute 0.0  0.0 - 0.1 K/uL  WET PREP, GENITAL     Status: Abnormal   Collection Time   03/07/12  2:03 PM      Component Value Range   Yeast Wet Prep HPF POC NONE SEEN  NONE SEEN   Trich, Wet Prep NONE SEEN  NONE SEEN   Clue Cells Wet Prep HPF POC NONE SEEN  NONE SEEN   WBC, Wet Prep HPF POC MODERATE (*) NONE SEEN    MAU Course  Procedures US Transvaginal Non-ob  03/07/2012  *RADIOLOGY REPORT*  Clinical Data: Pelvic pain.  Vaginal bleeding.  LMP 03/06/2012.   TRANSABDOMINAL AND TRANSVAGINAL ULTRASOUND OF PELVIS  Technique:  Both transabdominal and transvaginal ultrasound examinations of the pelvis were performed.  Transabdominal technique was performed for global imaging of  the pelvis including uterus, ovaries, adnexal regions, and pelvic cul-de-sac.  It was necessary to proceed with endovaginal exam following the transabdominal exam to visualize the retroverted uterus and endometrium.  Comparison:  None.  Findings: Uterus:  7.3 x 3.8 x 4.2 cm.  Retroverted.  A small subserosal fibroid is seen in the uterine fundus measuring 1.2 cm in maximal diameter.  No other uterine masses are identified.  Endometrium: Double layer thickness measures 3 mm transvaginally. No focal lesion visualized.  Right ovary: 2.7 x 1.6 x 2.2 cm.  Normal appearance.  Left ovary: 3.1 x 1.9 x 1.8 cm.  Normal appearance.  Other Findings:  A small amount of simple free fluid in the cul-de- sac.  IMPRESSION:  1.  Retroverted uterus with 1.2 cm subserosal fibroid in fundus. 2.  Normal appearance of both ovaries.  No adnexal mass identified.   Original Report Authenticated By: Myles Rosenthal, M.D.    US Pelvis Complete  03/07/2012  *RADIOLOGY REPORT*  Clinical Data: Pelvic pain.  Vaginal bleeding.  LMP 03/06/2012.  TRANSABDOMINAL AND TRANSVAGINAL ULTRASOUND OF PELVIS  Technique:  Both transabdominal and transvaginal ultrasound examinations of the pelvis were performed.  Transabdominal technique was performed for global imaging of the pelvis including uterus, ovaries, adnexal regions, and pelvic cul-de-sac.  It was necessary to proceed with endovaginal exam following the transabdominal exam to visualize the retroverted uterus and endometrium.  Comparison:  None.  Findings: Uterus:  7.3 x 3.8 x 4.2 cm.  Retroverted.  A small subserosal fibroid is seen in the uterine fundus measuring 1.2 cm in maximal diameter.  No other uterine masses are identified.  Endometrium: Double layer thickness measures 3 mm  transvaginally. No focal lesion visualized.  Right ovary: 2.7 x 1.6 x 2.2 cm.  Normal appearance.  Left ovary: 3.1 x 1.9 x 1.8 cm.  Normal appearance.  Other Findings:  A small amount of simple free fluid in the cul-de- sac.  IMPRESSION:  1.  Retroverted uterus with 1.2 cm subserosal fibroid in fundus. 2.  Normal appearance of both ovaries.  No adnexal mass identified.   Original Report Authenticated By: Myles Rosenthal, M.D.      Assessment: 21 y.o. female with abdominal pain and vaginal bleeding   Dysmenorrhea   Uterine Fibroid  Plan:  Toradol 60 mg IM   Rx Anaprox   Follow up GYN Clinic, return as needed    Reevaluation: After Toradol patient is feeling much better I have reviewed this patient's vital signs, nurses notes, appropriate labs and imaging. I discussed findings with the patient and she voices understanding.   Medication List     As of 03/08/2012  2:01 PM    CHANGE how you take these medications         naproxen sodium 550 MG tablet   Commonly known as: ANAPROX   Take 1 tablet (550 mg total) by mouth 2 (two) times daily with a meal.   What changed: - medication strength - dose - doctor's instructions          Where to get your medications    These are the prescriptions that you need to pick up. We sent them to a specific pharmacy, so you will need to go there to get them.   [id number] - [place] ([place]), [place] - [address]    [address]  ([place]) [place] [address]    Phone: [phone]        naproxen sodium 550 MG tablet  Greysin Medlen, RN, FNP, [facility] 03/07/2012, 3:40 PM

## 2012-03-07 NOTE — MAU Note (Signed)
Pt states has pain in her uterus, states pain began today at school. Pt is crying and rocking in triage room. Pain is bilateral lower abdomen area, denies bleeding or abnormal vag d/c.

## 2012-03-07 NOTE — MAU Note (Addendum)
Pt reports pain started this morning before school. Pain is 10/10; vaginal bleeding/period began last night. Small clots with heavy bleeding. Pt reports irregular cycles, usually bleeds for 3 days.  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

## 2012-03-08 ENCOUNTER — Encounter: Payer: Self-pay | Admitting: Obstetrics & Gynecology

## 2012-03-08 LAB — GC/CHLAMYDIA PROBE AMP, GENITAL: GC Probe Amp, Genital: NEGATIVE

## 2012-03-09 NOTE — MAU Provider Note (Signed)
Attestation of Attending Supervision of Advanced Practitioner (CNM/NP): Evaluation and management procedures were performed by the Advanced Practitioner under my supervision and collaboration.  I have reviewed the Advanced Practitioner's note and chart, and I agree with the management and plan.  UGONNA  ANYANWU, MD, FACOG Attending Obstetrician & Gynecologist Faculty Practice, [facility]  

## 2012-03-20 ENCOUNTER — Ambulatory Visit (INDEPENDENT_AMBULATORY_CARE_PROVIDER_SITE_OTHER): Payer: Self-pay | Admitting: Obstetrics & Gynecology

## 2012-03-20 ENCOUNTER — Encounter: Payer: Self-pay | Admitting: Obstetrics & Gynecology

## 2012-03-20 VITALS — BP 118/67 | HR 85 | Temp 98.4°F | Ht 66.0 in | Wt 246.4 lb

## 2012-03-20 DIAGNOSIS — N946 Dysmenorrhea, unspecified: Secondary | ICD-10-CM

## 2012-03-20 DIAGNOSIS — N926 Irregular menstruation, unspecified: Secondary | ICD-10-CM

## 2012-03-20 NOTE — Progress Notes (Signed)
States here for referral from MAU for dysmenorrhea, fibroids,and history abnormal pap. Patient states [facility] - told her she had extra cells.Did not get follow up because moved to [place] for a while, and now back home- went to MAU because of pain and they sent her here. States has irregular periods and has been trying to get pregnant but hasn't gotten period

## 2012-03-20 NOTE — Progress Notes (Signed)
Patient ID: Laura Vargas, female   DOB: [birth year], 21 y.o.   MRN: [id number]  Chief Complaint  Patient presents with  . Follow-up    MAU referral for fibroids, dysmenorrhea, abnormal pap    HPI Laura Vargas is a 21 y.o. female.  Seen in [facility]U 03/06/12 for bleeding and dysmenorrhea. A small subserosal fibroid was seen. She uses only condoms, was non-compliant with OCP and DMPA in the past. Same partner for several years, wants to conceive HPI  Past Medical History  Diagnosis Date  . Ovarian cyst   . Bronchitis   . Shingles   . Breast cyst   . Dysmenorrhea   . Fibroids   1.2 cm subserosal  Past Surgical History  Procedure Date  . Breast lumpectomy     Family History  Problem Relation Age of Onset  . Hypertension Father     Social History History  Substance Use Topics  . Smoking status: Current Every Day Smoker    Types: Cigarettes  . Smokeless tobacco: Never Used  . Alcohol Use: No    No Known Allergies  Current Outpatient Prescriptions  Medication Sig Dispense Refill  . naproxen sodium (ALEVE) 220 MG tablet Take 220 mg by mouth 2 (two) times daily with a meal.      . naproxen sodium (ANAPROX DS) 550 MG tablet Take 1 tablet (550 mg total) by mouth 2 (two) times daily with a meal.  15 tablet  0    Review of Systems Review of Systems  Constitutional: Negative for fever.  Genitourinary: Positive for menstrual problem. Negative for vaginal bleeding, vaginal discharge and dyspareunia.    Blood pressure 118/67, pulse 85, temperature 98.4 F (36.9 C), height 5\' 6"  (1.676 m), weight 246 lb 6.4 oz (111.766 kg), last menstrual period 03/06/2012.  Physical Exam Physical Exam  Nursing note and vitals reviewed. Constitutional: She is oriented to person, place, and time.       Morbidly obese  Pulmonary/Chest: Effort normal. No respiratory distress.  Abdominal: She exhibits no mass. There is no tenderness.  Genitourinary: Vagina normal and uterus normal. No  vaginal discharge found.  Neurological: She is alert and oriented to person, place, and time.  Skin: Skin is warm and dry.  Psychiatric: She has a normal mood and affect. Her behavior is normal.    Data Reviewed *RADIOLOGY REPORT*  Clinical Data: Pelvic pain. Vaginal bleeding. LMP 03/06/2012.  TRANSABDOMINAL AND TRANSVAGINAL ULTRASOUND OF PELVIS  Technique: Both transabdominal and transvaginal ultrasound  examinations of the pelvis were performed. Transabdominal  technique was performed for global imaging of the pelvis including  uterus, ovaries, adnexal regions, and pelvic cul-de-sac.  It was necessary to proceed with endovaginal exam following the  transabdominal exam to visualize the retroverted uterus and  endometrium.  Comparison: None.  Findings:  Uterus: 7.3 x 3.8 x 4.2 cm. Retroverted. A small subserosal  fibroid is seen in the uterine fundus measuring 1.2 cm in maximal  diameter. No other uterine masses are identified.  Endometrium: Double layer thickness measures 3 mm transvaginally.  No focal lesion visualized.  Right ovary: 2.7 x 1.6 x 2.2 cm. Normal appearance.  Left ovary: 3.1 x 1.9 x 1.8 cm. Normal appearance.  Other Findings: A small amount of simple free fluid in the cul-de-  sac.  IMPRESSION:  1. Retroverted uterus with 1.2 cm subserosal fibroid in fundus.  2. Normal appearance of both ovaries. No adnexal mass identified.  Original Report Authenticated By: Myles Rosenthal, M.D.  Assessment    Dysmenorrhea and irregular period     Plan    Return prn or for infertility. Menstrual calendar.       Laura Vargas 03/20/2012, 3:04 PM

## 2012-03-20 NOTE — Patient Instructions (Signed)
Dysmenorrhea  Menstrual pain is caused by the muscles of the uterus tightening (contracting) during a menstrual period. The muscles of the uterus contract due to the chemicals in the uterine lining.  Primary dysmenorrhea is menstrual cramps that last a couple of days when you start having menstrual periods or soon after. This often begins after a teenager starts having her period. As a woman gets older or has a baby, the cramps will usually lesson or disappear.  Secondary dysmenorrhea begins later in life, lasts longer, and the pain may be stronger than primary dysmenorrhea. The pain may start before the period and last a few days after the period. This type of dysmenorrhea is usually caused by an underlying problem such as:  · The tissue lining the uterus grows outside of the uterus in other areas of the body (endometriosis).  · The endometrial tissue, which normally lines the uterus, is found in or grows into the muscular walls of the uterus (adenomyosis).  · The pelvic blood vessels are engorged with blood just before the menstrual period (pelvic congestive syndrome).  · Overgrowth of cells in the lining of the uterus or cervix (polyps of the uterus or cervix).  · Falling down of the uterus (prolapse) because of loose or stretched ligaments.  · Depression.  · Bladder problems, infection, or inflammation.  · Problems with the intestine, a tumor, or irritable bowel syndrome.  · Cancer of the female organs or bladder.  · A severely tipped uterus.  · A very tight opening or closed cervix.  · Noncancerous tumors of the uterus (fibroids).  · Pelvic inflammatory disease (PID).  · Pelvic scarring (adhesions) from a previous surgery.  · Ovarian cyst.  · An intrauterine device (IUD) used for birth control.  CAUSES   The cause of menstrual pain is often unknown.  SYMPTOMS   · Cramping or throbbing pain in your lower abdomen.  · Sometimes, a woman may also experience headaches.  · Lower back pain.  · Feeling sick to your  stomach (nausea) or vomiting.  · Diarrhea.  · Sweating or dizziness.  DIAGNOSIS   A diagnosis is based on your history, symptoms, physical examination, diagnostic tests, or procedures. Diagnostic tests or procedures may include:  · Blood tests.  · An ultrasound.  · An examination of the lining of the uterus (dilation and curettage, D&C).  · An examination inside your abdomen or pelvis with a scope (laparoscopy).  · X-rays.  · CT Scan.  · MRI.  · An examination inside the bladder with a scope (cystoscopy).  · An examination inside the intestine or stomach with a scope (colonoscopy, gastroscopy).  TREATMENT   Treatment depends on the cause of the dysmenorrhea. Treatment may include:  · Pain medicine prescribed by your caregiver.  · Birth control pills.  · Hormone replacement therapy.  · Nonsteroidal anti-inflammatory drugs (NSAIDs). These may help stop the production of prostaglandins.  · An IUD with progesterone hormone in it.  · Acupuncture.  · Surgery to remove adhesions, endometriosis, ovarian cyst, or fibroids.  · Removal of the uterus (hysterectomy).  · Progesterone shots to stop the menstrual period.  · Cutting the nerves on the sacrum that go to the female organs (presacral neurectomy).  · Electric currant to the sacral nerves (sacral nerve stimulation).  · Antidepressant medicine.  · Psychiatric therapy, counseling, or group therapy.  · Exercise and physical therapy.  · Meditation and yoga therapy.  HOME CARE INSTRUCTIONS   · Only take over-the-counter   or prescription medicines for pain, discomfort, or fever as directed by your caregiver.  · Place a heating pad or hot water bottle on your lower back or abdomen. Do not sleep with the heating pad.  · Use aerobic exercises, walking, swimming, biking, and other exercises to help lessen the cramping.  · Massage to the lower back or abdomen may help.  · Stop smoking.  · Avoid alcohol and caffeine.  · Yoga, meditation, or acupuncture may help.  SEEK MEDICAL CARE IF:    · The pain does not get better with medicine.  · You have pain with sexual intercourse.  SEEK IMMEDIATE MEDICAL CARE IF:   · Your pain increases and is not controlled with medicines.  · You have a fever.  · You develop nausea or vomiting with your period not controlled with medicine.  · You have abnormal vaginal bleeding with your period.  · You pass out.  MAKE SURE YOU:   · Understand these instructions.  · Will watch your condition.  · Will get help right away if you are not doing well or get worse.  Document Released: 04/12/2005 Document Revised: 07/05/2011 Document Reviewed: 07/29/2008  [employer]® Patient Information ©2013 [employer].

## 2012-06-06 ENCOUNTER — Encounter (HOSPITAL_COMMUNITY): Payer: Self-pay | Admitting: Emergency Medicine

## 2012-06-06 ENCOUNTER — Emergency Department (HOSPITAL_COMMUNITY)
Admission: EM | Admit: 2012-06-06 | Discharge: 2012-06-06 | Disposition: A | Discharge status: Home / Self Care | Payer: Self-pay | Attending: Emergency Medicine | Admitting: Emergency Medicine

## 2012-06-06 DIAGNOSIS — Z8619 Personal history of other infectious and parasitic diseases: Secondary | ICD-10-CM | POA: Insufficient documentation

## 2012-06-06 DIAGNOSIS — K5289 Other specified noninfective gastroenteritis and colitis: Secondary | ICD-10-CM | POA: Insufficient documentation

## 2012-06-06 DIAGNOSIS — Z8742 Personal history of other diseases of the female genital tract: Secondary | ICD-10-CM | POA: Insufficient documentation

## 2012-06-06 DIAGNOSIS — F17200 Nicotine dependence, unspecified, uncomplicated: Secondary | ICD-10-CM | POA: Insufficient documentation

## 2012-06-06 DIAGNOSIS — R197 Diarrhea, unspecified: Secondary | ICD-10-CM | POA: Insufficient documentation

## 2012-06-06 DIAGNOSIS — Z8709 Personal history of other diseases of the respiratory system: Secondary | ICD-10-CM | POA: Insufficient documentation

## 2012-06-06 DIAGNOSIS — R1033 Periumbilical pain: Secondary | ICD-10-CM | POA: Insufficient documentation

## 2012-06-06 DIAGNOSIS — K529 Noninfective gastroenteritis and colitis, unspecified: Secondary | ICD-10-CM

## 2012-06-06 DIAGNOSIS — Z3202 Encounter for pregnancy test, result negative: Secondary | ICD-10-CM | POA: Insufficient documentation

## 2012-06-06 LAB — COMPREHENSIVE METABOLIC PANEL
ALT: 15 U/L (ref 0–35)
Albumin: 4 g/dL (ref 3.5–5.2)
Alkaline Phosphatase: 71 U/L (ref 39–117)
BUN: 10 mg/dL (ref 6–23)
Chloride: 99 mEq/L (ref 96–112)
Glucose, Bld: 105 mg/dL — ABNORMAL HIGH (ref 70–99)
Potassium: 3.5 mEq/L (ref 3.5–5.1)
Sodium: 136 mEq/L (ref 135–145)
Total Bilirubin: 0.2 mg/dL — ABNORMAL LOW (ref 0.3–1.2)
Total Protein: 8.3 g/dL (ref 6.0–8.3)

## 2012-06-06 LAB — CBC WITH DIFFERENTIAL/PLATELET
Basophils Relative: 0 % (ref 0–1)
Eosinophils Absolute: 0 10*3/uL (ref 0.0–0.7)
Hemoglobin: 13.7 g/dL (ref 12.0–15.0)
Lymphs Abs: 1.8 10*3/uL (ref 0.7–4.0)
MCH: 29.5 pg (ref 26.0–34.0)
Monocytes Relative: 6 % (ref 3–12)
Neutro Abs: 7.7 10*3/uL (ref 1.7–7.7)
Neutrophils Relative %: 76 % (ref 43–77)
Platelets: 321 10*3/uL (ref 150–400)
RBC: 4.64 MIL/uL (ref 3.87–5.11)

## 2012-06-06 LAB — URINALYSIS, ROUTINE W REFLEX MICROSCOPIC
Bilirubin Urine: NEGATIVE
Hgb urine dipstick: NEGATIVE
Ketones, ur: 15 mg/dL — AB
Specific Gravity, Urine: 1.019 (ref 1.005–1.030)
pH: 6 (ref 5.0–8.0)

## 2012-06-06 MED ORDER — HYDROCODONE-ACETAMINOPHEN 5-325 MG PO TABS
1.0000 | ORAL_TABLET | Freq: Once | ORAL | Status: AC
  Administered 2012-06-06: 1 via ORAL
  Filled 2012-06-06: qty 1

## 2012-06-06 MED ORDER — ONDANSETRON HCL 4 MG/2ML IJ SOLN
4.0000 mg | Freq: Once | INTRAMUSCULAR | Status: AC
  Administered 2012-06-06: 4 mg via INTRAVENOUS
  Filled 2012-06-06: qty 2

## 2012-06-06 MED ORDER — TRAMADOL HCL 50 MG PO TABS: 50.0000 mg | ORAL_TABLET | Freq: Four times a day (QID) | ORAL | Status: DC | PRN

## 2012-06-06 MED ORDER — ONDANSETRON HCL 4 MG PO TABS: 4.0000 mg | ORAL_TABLET | Freq: Four times a day (QID) | ORAL | Status: DC

## 2012-06-06 MED ORDER — SODIUM CHLORIDE 0.9 % IV BOLUS (SEPSIS)
1000.0000 mL | Freq: Once | INTRAVENOUS | Status: AC
  Administered 2012-06-06: 1000 mL via INTRAVENOUS

## 2012-06-06 MED ORDER — MORPHINE SULFATE 4 MG/ML IJ SOLN
4.0000 mg | Freq: Once | INTRAMUSCULAR | Status: AC
  Administered 2012-06-06: 4 mg via INTRAVENOUS
  Filled 2012-06-06: qty 1

## 2012-06-06 NOTE — ED Provider Notes (Signed)
History  This chart was scribed for Loren Racer, MD by Shari Heritage, ED Scribe. The patient was seen in room WA21/WA21. Patient's care was started at 1800.   CSN: [id number]  Arrival date & time 06/06/12  1727   First MD Initiated Contact with Patient 06/06/12 1800      Chief Complaint  Patient presents with  . Emesis  . Abdominal Pain     Patient is a 22 y.o. female presenting with vomiting and abdominal pain. The history is provided by the patient. No language interpreter was used.  Emesis Duration:  3 days Associated symptoms: abdominal pain and diarrhea   Associated symptoms: no chills   Abdominal Pain Pain location:  Epigastric Pain radiates to:  Does not radiate Pain severity:  Moderate Timing:  Constant Progression:  Unchanged Relieved by:  Nothing Ineffective treatments:  None tried Associated symptoms: diarrhea and vomiting   Associated symptoms: no chills and no fever     HPI Comments: Laura Vargas is a 22 y.o. female who presents to the Emergency Department complaining of periumbilical abdominal pain and emesis onset 3 days ago. Abdominal pain is moderate to severe, constant, and non-radiating. There is associated diarrhea. Her last BM was today and states that stool is watery. She is intolerant of fluids and food. There is no fever or chills. She states that she has relatives with similar symptoms. She reports no history of similar abdominal pain or abdominal surgeries. She is a current every day smoker.   Past Medical History  Diagnosis Date  . Ovarian cyst   . Bronchitis   . Shingles   . Breast cyst   . Dysmenorrhea   . Fibroids     Past Surgical History  Procedure Laterality Date  . Breast lumpectomy      Family History  Problem Relation Age of Onset  . Hypertension Father     History  Substance Use Topics  . Smoking status: Current Every Day Smoker    Types: Cigarettes  . Smokeless tobacco: Never Used  . Alcohol Use: No    OB  History   Grav Para Term Preterm Abortions TAB SAB Ect Mult Living   0               Review of Systems  Constitutional: Negative for fever and chills.  Gastrointestinal: Positive for vomiting, abdominal pain and diarrhea.  All other systems reviewed and are negative.    Allergies  Review of patient's allergies indicates no known allergies.  Home Medications   Current Outpatient Rx  Name  Route  Sig  Dispense  Refill  . ondansetron (ZOFRAN) 4 MG tablet   Oral   Take 1 tablet (4 mg total) by mouth every 6 (six) hours.   12 tablet   0   . traMADol (ULTRAM) 50 MG tablet   Oral   Take 1 tablet (50 mg total) by mouth every 6 (six) hours as needed for pain.   15 tablet   0     Triage Vitals: BP 132/86  Pulse 96  Temp(Src) 97.9 F (36.6 C) (Oral)  Resp 19  SpO2 100%  LMP 05/30/2012  Physical Exam  Constitutional: She is oriented to person, place, and time. She appears well-developed and well-nourished.  HENT:  Head: Normocephalic and atraumatic.  Eyes: Conjunctivae and EOM are normal. Pupils are equal, round, and reactive to light.  Neck: Normal range of motion. Neck supple.  Cardiovascular: Normal rate and regular rhythm.   Pulmonary/Chest:  Effort normal and breath sounds normal.  Abdominal: Soft. Normal appearance. There is tenderness in the periumbilical area. There is no rebound, no guarding and no tenderness at McBurney's point.  Musculoskeletal: Normal range of motion.  Neurological: She is alert and oriented to person, place, and time.  Skin: Skin is warm and dry.    ED Course  Procedures (including critical care time) DIAGNOSTIC STUDIES: Oxygen Saturation is 100% on room air, normal by my interpretation.    COORDINATION OF CARE: 6:08 PM- Patient informed of current plan for treatment and evaluation and agrees with plan at this time.   9:15 PM- Abdomen is soft and nontender.Patient is improved after IV fluids, Zofran, morphine and Vicodin. No vomiting in  the ED. Tolerating fluids.  10:12 PM- Patient reasonably evaluated and stable for discharge. Instructed to return for worsening pain, inability to tolerate fluids, or fever. Patient verbalizes understanding and agrees.   Labs Reviewed  COMPREHENSIVE METABOLIC PANEL - Abnormal; Notable for the following:    Glucose, Bld 105 (*)    Total Bilirubin 0.2 (*)    All other components within normal limits  URINALYSIS, ROUTINE W REFLEX MICROSCOPIC - Abnormal; Notable for the following:    APPearance CLOUDY (*)    Ketones, ur 15 (*)    All other components within normal limits  CBC WITH DIFFERENTIAL  LIPASE, BLOOD  PREGNANCY, URINE     1. Gastroenteritis       MDM  I personally performed the services described in this documentation, which was scribed in my presence. The recorded information has been reviewed and is accurate.   Low suspicion for appendicitis or other surgical emergencies based duration of symptoms, relatives with similar illness, and improvement of symptoms in ED. Pt tolerating PO's. Encouraged to return for worsening symptoms, fever or any concerns   Loren Racer, MD 06/06/12 2348

## 2012-06-06 NOTE — ED Notes (Signed)
States that she has been vomiting since Saturday

## 2012-08-16 ENCOUNTER — Encounter (HOSPITAL_BASED_OUTPATIENT_CLINIC_OR_DEPARTMENT_OTHER): Payer: Self-pay | Admitting: *Deleted

## 2012-08-16 ENCOUNTER — Emergency Department (HOSPITAL_BASED_OUTPATIENT_CLINIC_OR_DEPARTMENT_OTHER)
Admission: EM | Admit: 2012-08-16 | Discharge: 2012-08-17 | Disposition: A | Discharge status: Home / Self Care | Payer: Self-pay | Attending: Emergency Medicine | Admitting: Emergency Medicine

## 2012-08-16 ENCOUNTER — Emergency Department (HOSPITAL_BASED_OUTPATIENT_CLINIC_OR_DEPARTMENT_OTHER): Payer: Self-pay

## 2012-08-16 DIAGNOSIS — Z8709 Personal history of other diseases of the respiratory system: Secondary | ICD-10-CM | POA: Insufficient documentation

## 2012-08-16 DIAGNOSIS — Z8619 Personal history of other infectious and parasitic diseases: Secondary | ICD-10-CM | POA: Insufficient documentation

## 2012-08-16 DIAGNOSIS — Z8742 Personal history of other diseases of the female genital tract: Secondary | ICD-10-CM | POA: Insufficient documentation

## 2012-08-16 DIAGNOSIS — M67432 Ganglion, left wrist: Secondary | ICD-10-CM

## 2012-08-16 DIAGNOSIS — F17200 Nicotine dependence, unspecified, uncomplicated: Secondary | ICD-10-CM | POA: Insufficient documentation

## 2012-08-16 DIAGNOSIS — M6740 Ganglion, unspecified site: Secondary | ICD-10-CM | POA: Insufficient documentation

## 2012-08-16 MED ORDER — HYDROCODONE-ACETAMINOPHEN 5-325 MG PO TABS
1.0000 | ORAL_TABLET | Freq: Once | ORAL | Status: DC
  Filled 2012-08-16: qty 1

## 2012-08-16 MED ORDER — HYDROCODONE-ACETAMINOPHEN 5-325 MG PO TABS: 1.0000 | ORAL_TABLET | Freq: Four times a day (QID) | ORAL | Status: DC | PRN

## 2012-08-16 NOTE — ED Notes (Addendum)
Pt c/o left wrist pain w/o injury x 2 days nodule like area noted to top of hean

## 2012-08-16 NOTE — ED Provider Notes (Signed)
History     CSN: [id number]  Arrival date & time 08/16/12  1943   First MD Initiated Contact with Patient 08/16/12 2342      Chief Complaint  Patient presents with  . Wrist Pain    (Consider location/radiation/quality/duration/timing/severity/associated sxs/prior treatment) HPI This is a 22 year old female with a long-standing history of a mass on the dorsum of her left wrist. It is about 2 cm in diameter. It has become acutely painful the last 2 days to the point that it is preventing her from sleeping. It is worse with flexion of the wrist or with palpation. There is no erythema or warmth associated with it. There is no functional deficit or sensory deficit of the hand distally. She denies other acute complaints.  Past Medical History  Diagnosis Date  . Ovarian cyst   . Bronchitis   . Shingles   . Breast cyst   . Dysmenorrhea   . Fibroids     Past Surgical History  Procedure Laterality Date  . Breast lumpectomy      Family History  Problem Relation Age of Onset  . Hypertension Father     History  Substance Use Topics  . Smoking status: Current Every Day Smoker    Types: Cigarettes  . Smokeless tobacco: Never Used  . Alcohol Use: No    OB History   Grav Para Term Preterm Abortions TAB SAB Ect Mult Living   0               Review of Systems  All other systems reviewed and are negative.    Allergies  Review of patient's allergies indicates no known allergies.  Home Medications   Current Outpatient Rx  Name  Route  Sig  Dispense  Refill  . ondansetron (ZOFRAN) 4 MG tablet   Oral   Take 1 tablet (4 mg total) by mouth every 6 (six) hours.   12 tablet   0   . traMADol (ULTRAM) 50 MG tablet   Oral   Take 1 tablet (50 mg total) by mouth every 6 (six) hours as needed for pain.   15 tablet   0     BP 121/64  Pulse 83  Temp(Src) 98.9 F (37.2 C) (Oral)  Resp 16  SpO2 100%  LMP 06/30/2012  Physical Exam General: Well-developed,  well-nourished female in no acute distress; appearance consistent with age of record HENT: normocephalic, atraumatic Eyes: Normal appearance Neck: supple Heart: regular rate and rhythm Lungs: Normal respiratory effort and excursion Abdomen: soft; nondistended Extremities: No deformity; full range of motion; tender, 2cm fluid-filled mass, dorsal left wrist consistent with ganglion cyst, no associated erythema or warmth, motor and sensory function intact distally with brisk capillary refill Neurologic: Awake, alert and oriented; motor function intact in all extremities and symmetric; no facial droop Skin: Warm and dry Psychiatric: Normal mood and affect    ED Course  Procedures (including critical care time)      MDM  Will refer to hand surgery for definitive treatment.         Hanley Seamen, MD 08/16/12 2352

## 2013-05-30 ENCOUNTER — Ambulatory Visit (INDEPENDENT_AMBULATORY_CARE_PROVIDER_SITE_OTHER): Payer: No Typology Code available for payment source | Admitting: Emergency Medicine

## 2013-05-30 VITALS — BP 126/78 | HR 104 | Temp 100.3°F | Resp 17 | Ht 66.5 in | Wt 249.0 lb

## 2013-05-30 DIAGNOSIS — J111 Influenza due to unidentified influenza virus with other respiratory manifestations: Secondary | ICD-10-CM

## 2013-05-30 MED ORDER — HYDROCOD POLST-CHLORPHEN POLST 10-8 MG/5ML PO LQCR: 5.0000 mL | Freq: Two times a day (BID) | ORAL | Status: DC | PRN

## 2013-05-30 MED ORDER — PSEUDOEPHEDRINE-GUAIFENESIN ER 60-600 MG PO TB12
1.0000 | ORAL_TABLET | Freq: Two times a day (BID) | ORAL | Status: AC
Start: 2013-05-30 — End: 2014-05-30

## 2013-05-30 MED ORDER — OSELTAMIVIR PHOSPHATE 75 MG PO CAPS: 75.0000 mg | ORAL_CAPSULE | Freq: Two times a day (BID) | ORAL | Status: DC

## 2013-05-30 NOTE — Patient Instructions (Signed)

## 2013-05-30 NOTE — Progress Notes (Signed)
[facility] [address] [place]  [address] [phone]  Date:  05/30/2013   Name:  Laura Vargas   DOB:  March 02, 1991   MRN:  [id number]  PCP:  No primary provider on file.    Chief Complaint: Generalized Body Aches, Fever and Insomnia   History of Present Illness:  Laura Vargas is a 23 y.o. very pleasant female patient who presents with the following:  Ill for past two days with nasal congestion, post nasal drip, cough productive mucoid sputum.  Has a fever and aches, myalgias, and fatigue.  No nausea or vomiting. No wheezing or shortness of breath.  No rash. No improvement with over the counter medications or other home remedies. Denies other complaint or health concern today.   Patient Active Problem List   Diagnosis Date Noted  . Irregular menstrual cycle 03/20/2012  . Dysmenorrhea 03/20/2012    Past Medical History  Diagnosis Date  . Ovarian cyst   . Bronchitis   . Shingles   . Breast cyst   . Dysmenorrhea   . Fibroids     Past Surgical History  Procedure Laterality Date  . Breast lumpectomy      History  Substance Use Topics  . Smoking status: Current Every Day Smoker -- 0.50 packs/day for 6 years    Types: Cigarettes  . Smokeless tobacco: Never Used  . Alcohol Use: No    Family History  Problem Relation Age of Onset  . Hypertension Father     No Known Allergies  Medication list has been reviewed and updated.  No current outpatient prescriptions on file prior to visit.   No current facility-administered medications on file prior to visit.    Review of Systems:  As per HPI, otherwise negative.    Physical Examination: Filed Vitals:   05/30/13 0945  BP: 126/78  Pulse: 104  Temp: 100.3 F (37.9 C)  Resp: 17   Filed Vitals:   05/30/13 0945  Height: 5' 6.5" (1.689 m)  Weight: 249 lb (112.946 kg)   Body mass index is 39.59 kg/(m^2). Ideal Body Weight: Weight in (lb) to have BMI = 25: 156.9  GEN: very  obese, mod distress, Non-toxic, A & O x 3 HEENT: Atraumatic, Normocephalic. Neck supple. No masses, No LAD. Ears and Nose: No external deformity. CV: RRR, No M/G/R. No JVD. No thrill. No extra heart sounds. PULM: CTA B, no wheezes, crackles, rhonchi. No retractions. No resp. distress. No accessory muscle use. ABD: S, NT, ND, +BS. No rebound. No HSM. EXTR: No c/c/e NEURO Normal gait.  PSYCH: Normally interactive. Conversant. Not depressed or anxious appearing.  Calm demeanor.    Assessment and Plan: Influenza tamiflu Tylenol mucinex d tussionex   Signed,  Ellison Carwin, MD

## 2014-02-04 ENCOUNTER — Encounter (HOSPITAL_BASED_OUTPATIENT_CLINIC_OR_DEPARTMENT_OTHER): Payer: Self-pay | Admitting: Emergency Medicine

## 2014-02-04 ENCOUNTER — Emergency Department (HOSPITAL_BASED_OUTPATIENT_CLINIC_OR_DEPARTMENT_OTHER)
Admission: EM | Admit: 2014-02-04 | Discharge: 2014-02-04 | Disposition: A | Discharge status: Home / Self Care | Payer: Self-pay | Attending: Emergency Medicine | Admitting: Emergency Medicine

## 2014-02-04 DIAGNOSIS — Z79899 Other long term (current) drug therapy: Secondary | ICD-10-CM | POA: Insufficient documentation

## 2014-02-04 DIAGNOSIS — A5901 Trichomonal vulvovaginitis: Secondary | ICD-10-CM | POA: Insufficient documentation

## 2014-02-04 DIAGNOSIS — N760 Acute vaginitis: Secondary | ICD-10-CM | POA: Insufficient documentation

## 2014-02-04 DIAGNOSIS — Z8709 Personal history of other diseases of the respiratory system: Secondary | ICD-10-CM | POA: Insufficient documentation

## 2014-02-04 DIAGNOSIS — Z3202 Encounter for pregnancy test, result negative: Secondary | ICD-10-CM | POA: Insufficient documentation

## 2014-02-04 DIAGNOSIS — Z720 Tobacco use: Secondary | ICD-10-CM | POA: Insufficient documentation

## 2014-02-04 DIAGNOSIS — B9689 Other specified bacterial agents as the cause of diseases classified elsewhere: Secondary | ICD-10-CM

## 2014-02-04 LAB — URINALYSIS, ROUTINE W REFLEX MICROSCOPIC
Bilirubin Urine: NEGATIVE
Glucose, UA: NEGATIVE mg/dL
Ketones, ur: NEGATIVE mg/dL
Nitrite: NEGATIVE
Protein, ur: NEGATIVE mg/dL
Specific Gravity, Urine: 1.02 (ref 1.005–1.030)
Urobilinogen, UA: 0.2 mg/dL (ref 0.0–1.0)
pH: 6.5 (ref 5.0–8.0)

## 2014-02-04 LAB — WET PREP, GENITAL: YEAST WET PREP: NONE SEEN

## 2014-02-04 LAB — PREGNANCY, URINE: Preg Test, Ur: NEGATIVE

## 2014-02-04 LAB — URINE MICROSCOPIC-ADD ON

## 2014-02-04 MED ORDER — LIDOCAINE HCL (PF) 1 % IJ SOLN
INTRAMUSCULAR | Status: AC
  Administered 2014-02-04: 5 mL
  Filled 2014-02-04: qty 5

## 2014-02-04 MED ORDER — METRONIDAZOLE 500 MG PO TABS: 500.0000 mg | ORAL_TABLET | Freq: Two times a day (BID) | ORAL | Status: DC

## 2014-02-04 MED ORDER — AZITHROMYCIN 250 MG PO TABS
1000.0000 mg | ORAL_TABLET | Freq: Once | ORAL | Status: AC
  Administered 2014-02-04: 1000 mg via ORAL
  Filled 2014-02-04: qty 4

## 2014-02-04 MED ORDER — CEFTRIAXONE SODIUM 250 MG IJ SOLR
250.0000 mg | Freq: Once | INTRAMUSCULAR | Status: AC
  Administered 2014-02-04: 250 mg via INTRAMUSCULAR
  Filled 2014-02-04: qty 250

## 2014-02-04 NOTE — ED Provider Notes (Signed)
CSN: [id number]     Arrival date & time 02/04/14  1326 History   First MD Initiated Contact with Patient 02/04/14 1522     Chief Complaint  Patient presents with  . Vaginal Pain     (Consider location/radiation/quality/duration/timing/severity/associated sxs/prior Treatment) HPI Comments: This is a 23 y/o female with a PMHx of ovarian cysts, dysmenorrhea and fibroids who presents to the ED complaining of vaginal "tingling" x1 week. Pt reports 1 week ago she had intercourse with her boyfriend, and started "tingling", initially thinking he just "did it right". States the tingling has been constant since, worse after urinating. Denies vaginal discharge, increased urinary frequency, urgency, dysuria, hematuria. She is sexually active with one partner but does not use protection. No birth control. States she is slightly nauseated, however has not vomited. Denies fever or chills. She does not believe she has an STD, however would prefer to be treated. LMP was 1 month ago and normal.  Patient is a 23 y.o. female presenting with vaginal pain. The history is provided by the patient.  Vaginal Pain    Past Medical History  Diagnosis Date  . Ovarian cyst   . Bronchitis   . Shingles   . Breast cyst   . Dysmenorrhea   . Fibroids    Past Surgical History  Procedure Laterality Date  . Breast lumpectomy     Family History  Problem Relation Age of Onset  . Hypertension Father    History  Substance Use Topics  . Smoking status: Current Every Day Smoker -- 0.50 packs/day for 6 years    Types: Cigarettes  . Smokeless tobacco: Never Used  . Alcohol Use: No   OB History   Grav Para Term Preterm Abortions TAB SAB Ect Mult Living   0              Review of Systems  Genitourinary: Positive for vaginal pain.  All other systems reviewed and are negative.     Allergies  Review of patient's allergies indicates no known allergies.  Home Medications   Prior to Admission medications    Medication Sig Start Date End Date Taking? Authorizing Provider  chlorpheniramine-HYDROcodone (TUSSIONEX PENNKINETIC ER) 10-8 MG/5ML LQCR Take 5 mLs by mouth every 12 (twelve) hours as needed. 05/30/13   Roselee Culver, MD  metroNIDAZOLE (FLAGYL) 500 MG tablet Take 1 tablet (500 mg total) by mouth 2 (two) times daily. One po bid x 7 days 02/04/14   Carman Ching, PA-C  oseltamivir (TAMIFLU) 75 MG capsule Take 1 capsule (75 mg total) by mouth 2 (two) times daily. 05/30/13   Roselee Culver, MD  pseudoephedrine-guaifenesin (MUCINEX D) 60-600 MG per tablet Take 1 tablet by mouth every 12 (twelve) hours. 05/30/13 05/30/14  Roselee Culver, MD   BP 124/54  Pulse 91  Temp(Src) 98.5 F (36.9 C) (Oral)  Resp 18  Ht 5\' 6"  (1.676 m)  Wt 230 lb (104.327 kg)  BMI 37.14 kg/m2  SpO2 99%  LMP 12/25/2013 Physical Exam  Nursing note and vitals reviewed. Constitutional: She is oriented to person, place, and time. She appears well-developed and well-nourished. No distress.  HENT:  Head: Normocephalic and atraumatic.  Mouth/Throat: Oropharynx is clear and moist.  Eyes: Conjunctivae are normal.  Neck: Normal range of motion. Neck supple.  Cardiovascular: Normal rate, regular rhythm and normal heart sounds.   Pulmonary/Chest: Effort normal and breath sounds normal.  Abdominal: Soft. Bowel sounds are normal. There is no tenderness.  Genitourinary: Uterus normal.  There is no rash, tenderness or lesion on the right labia. There is no rash, tenderness or lesion on the left labia. Cervix exhibits no motion tenderness, no discharge and no friability. Right adnexum displays no mass, no tenderness and no fullness. Left adnexum displays no mass, no tenderness and no fullness. No erythema, tenderness or bleeding around the vagina. Vaginal discharge (scant, white) found.  Musculoskeletal: Normal range of motion. She exhibits no edema.  Neurological: She is alert and oriented to person, place, and time.  Skin: Skin  is warm and dry. She is not diaphoretic.  Psychiatric: She has a normal mood and affect. Her behavior is normal.    ED Course  Procedures (including critical care time) Labs Review Labs Reviewed  WET PREP, GENITAL - Abnormal; Notable for the following:    Trich, Wet Prep MANY (*)    Clue Cells Wet Prep HPF POC MODERATE (*)    WBC, Wet Prep HPF POC MODERATE (*)    All other components within normal limits  URINALYSIS, ROUTINE W REFLEX MICROSCOPIC - Abnormal; Notable for the following:    APPearance CLOUDY (*)    Hgb urine dipstick TRACE (*)    Leukocytes, UA LARGE (*)    All other components within normal limits  URINE MICROSCOPIC-ADD ON - Abnormal; Notable for the following:    Squamous Epithelial / LPF MANY (*)    Bacteria, UA MANY (*)    All other components within normal limits  GC/CHLAMYDIA PROBE AMP  PREGNANCY, URINE    Imaging Review No results found.   EKG Interpretation None      MDM   Final diagnoses:  Trichomonal vaginitis  BV (bacterial vaginosis)   Patient with vaginal tingling. Nontoxic appearing and in no apparent distress. Afebrile, vital signs stable. Abdomen is soft and nontender. Skin vaginal discharge on exam. No cervical motion or adnexal tenderness. Wet prep positive for Trichomonas and BV. I discussed these findings with patient and discussed treatment for STDs. Patient agreeable to receiving Rocephin and azithromycin in the emergency department. Discharge with Flagyl. Infection care/precautions discussed. Stable for discharge. Return precautions given. Patient states understanding of treatment care plan and is agreeable.   Carman Ching, PA-C 02/04/14 1620

## 2014-02-04 NOTE — ED Notes (Signed)
Vaginal tingling. Denies vaginal discharge.

## 2014-02-04 NOTE — Discharge Instructions (Signed)
Take Flagyl instructed for the next 7 days. You were also treated today for both gonorrhea and Chlamydia, however these cultures do not resolve for 48 hours. If they're positive, you'll be contacted and obligated to inform your partner. You are also obligated to inform your partner about the Trichomonas.  Bacterial Vaginosis Bacterial vaginosis is a vaginal infection that occurs when the normal balance of bacteria in the vagina is disrupted. It results from an overgrowth of certain bacteria. This is the most common vaginal infection in women of childbearing age. Treatment is important to prevent complications, especially in pregnant women, as it can cause a premature delivery. CAUSES  Bacterial vaginosis is caused by an increase in harmful bacteria that are normally present in smaller amounts in the vagina. Several different kinds of bacteria can cause bacterial vaginosis. However, the reason that the condition develops is not fully understood. RISK FACTORS Certain activities or behaviors can put you at an increased risk of developing bacterial vaginosis, including:  Having a new sex partner or multiple sex partners.  Douching.  Using an intrauterine device (IUD) for contraception. Women do not get bacterial vaginosis from toilet seats, bedding, swimming pools, or contact with objects around them. SIGNS AND SYMPTOMS  Some women with bacterial vaginosis have no signs or symptoms. Common symptoms include:  Grey vaginal discharge.  A fishlike odor with discharge, especially after sexual intercourse.  Itching or burning of the vagina and vulva.  Burning or pain with urination. DIAGNOSIS  Your health care provider will take a medical history and examine the vagina for signs of bacterial vaginosis. A sample of vaginal fluid may be taken. Your health care provider will look at this sample under a microscope to check for bacteria and abnormal cells. A vaginal pH test may also be done.  TREATMENT    Bacterial vaginosis may be treated with antibiotic medicines. These may be given in the form of a pill or a vaginal cream. A second round of antibiotics may be prescribed if the condition comes back after treatment.  HOME CARE INSTRUCTIONS   Only take over-the-counter or prescription medicines as directed by your health care provider.  If antibiotic medicine was prescribed, take it as directed. Make sure you finish it even if you start to feel better.  Do not have sex until treatment is completed.  Tell all sexual partners that you have a vaginal infection. They should see their health care provider and be treated if they have problems, such as a mild rash or itching.  Practice safe sex by using condoms and only having one sex partner. SEEK MEDICAL CARE IF:   Your symptoms are not improving after 3 days of treatment.  You have increased discharge or pain.  You have a fever. MAKE SURE YOU:   Understand these instructions.  Will watch your condition.  Will get help right away if you are not doing well or get worse. FOR MORE INFORMATION  Centers for Disease Control and Prevention, Division of STD Prevention: [employer] American Sexual Health Association (ASHA): www.ashastd.org  Document Released: 04/12/2005 Document Revised: 01/31/2013 Document Reviewed: 11/22/2012 [facility] Patient Information 2015 [place], [place]. This information is not intended to replace advice given to you by your health care provider. Make sure you discuss any questions you have with your health care provider.  Sexually Transmitted Disease A sexually transmitted disease (STD) is a disease or infection that may be passed (transmitted) from person to person, usually during sexual activity. This may happen by way of  saliva, semen, blood, vaginal mucus, or urine. Common STDs include:   Gonorrhea.   Chlamydia.   Syphilis.   HIV and AIDS.   Genital herpes.   Hepatitis B and C.   Trichomonas.    Human papillomavirus (HPV).   Pubic lice.   Scabies.  Mites.  Bacterial vaginosis. WHAT ARE CAUSES OF STDs? An STD may be caused by bacteria, a virus, or parasites. STDs are often transmitted during sexual activity if one person is infected. However, they may also be transmitted through nonsexual means. STDs may be transmitted after:   Sexual intercourse with an infected person.   Sharing sex toys with an infected person.   Sharing needles with an infected person or using unclean piercing or tattoo needles.  Having intimate contact with the genitals, mouth, or rectal areas of an infected person.   Exposure to infected fluids during birth. WHAT ARE THE SIGNS AND SYMPTOMS OF STDs? Different STDs have different symptoms. Some people may not have any symptoms. If symptoms are present, they may include:   Painful or bloody urination.   Pain in the pelvis, abdomen, vagina, anus, throat, or eyes.   A skin rash, itching, or irritation.  Growths, ulcerations, blisters, or sores in the genital and anal areas.  Abnormal vaginal discharge with or without bad odor.   Penile discharge in men.   Fever.   Pain or bleeding during sexual intercourse.   Swollen glands in the groin area.   Yellow skin and eyes (jaundice). This is seen with hepatitis.   Swollen testicles.  Infertility.  Sores and blisters in the mouth. HOW ARE STDs DIAGNOSED? To make a diagnosis, your health care provider may:   Take a medical history.   Perform a physical exam.   Take a sample of any discharge to examine.  Swab the throat, cervix, opening to the penis, rectum, or vagina for testing.  Test a sample of your first morning urine.   Perform blood tests.   Perform a Pap test, if this applies.   Perform a colposcopy.   Perform a laparoscopy.  HOW ARE STDs TREATED? Treatment depends on the STD. Some STDs may be treated but not cured.   Chlamydia, gonorrhea,  trichomonas, and syphilis can be cured with antibiotic medicine.   Genital herpes, hepatitis, and HIV can be treated, but not cured, with prescribed medicines. The medicines lessen symptoms.   Genital warts from HPV can be treated with medicine or by freezing, burning (electrocautery), or surgery. Warts may come back.   HPV cannot be cured with medicine or surgery. However, abnormal areas may be removed from the cervix, vagina, or vulva.   If your diagnosis is confirmed, your recent sexual partners need treatment. This is true even if they are symptom-free or have a negative culture or evaluation. They should not have sex until their health care providers say it is okay. HOW CAN I REDUCE MY RISK OF GETTING AN STD? Take these steps to reduce your risk of getting an STD:  Use latex condoms, dental dams, and water-soluble lubricants during sexual activity. Do not use petroleum jelly or oils.  Avoid having multiple sex partners.  Do not have sex with someone who has other sex partners.  Do not have sex with anyone you do not know or who is at high risk for an STD.  Avoid risky sex practices that can break your skin.  Do not have sex if you have open sores on your mouth or skin.  Avoid drinking too much alcohol or taking illegal drugs. Alcohol and drugs can affect your judgment and put you in a vulnerable position.  Avoid engaging in oral and anal sex acts.  Get vaccinated for HPV and hepatitis. If you have not received these vaccines in the past, talk to your health care provider about whether one or both might be right for you.   If you are at risk of being infected with HIV, it is recommended that you take a prescription medicine daily to prevent HIV infection. This is called pre-exposure prophylaxis (PrEP). You are considered at risk if:  You are a man who has sex with other men (MSM).  You are a heterosexual man or woman and are sexually active with more than one  partner.  You take drugs by injection.  You are sexually active with a partner who has HIV.  Talk with your health care provider about whether you are at high risk of being infected with HIV. If you choose to begin PrEP, you should first be tested for HIV. You should then be tested every 3 months for as long as you are taking PrEP.  WHAT SHOULD I DO IF I THINK I HAVE AN STD?  See your health care provider.   Tell your sexual partner(s). They should be tested and treated for any STDs.  Do not have sex until your health care provider says it is okay. WHEN SHOULD I GET IMMEDIATE MEDICAL CARE? Contact your health care provider right away if:   You have severe abdominal pain.  You are a man and notice swelling or pain in your testicles.  You are a woman and notice swelling or pain in your vagina. Document Released: 07/03/2002 Document Revised: 04/17/2013 Document Reviewed: 10/31/2012 [facility] Patient Information 2015 [place], [place]. This information is not intended to replace advice given to you by your health care provider. Make sure you discuss any questions you have with your health care provider.  Trichomoniasis Trichomoniasis is an infection caused by an organism called Trichomonas. The infection can affect both women and men. In women, the outer female genitalia and the vagina are affected. In men, the penis is mainly affected, but the prostate and other reproductive organs can also be involved. Trichomoniasis is a sexually transmitted infection (STI) and is most often passed to another person through sexual contact.  RISK FACTORS  Having unprotected sexual intercourse.  Having sexual intercourse with an infected partner. SIGNS AND SYMPTOMS  Symptoms of trichomoniasis in women include:  Abnormal gray-green frothy vaginal discharge.  Itching and irritation of the vagina.  Itching and irritation of the area outside the vagina. Symptoms of trichomoniasis in men include:    Penile discharge with or without pain.  Pain during urination. This results from inflammation of the urethra. DIAGNOSIS  Trichomoniasis may be found during a Pap test or physical exam. Your health care provider may use one of the following methods to help diagnose this infection:  Examining vaginal discharge under a microscope. For men, urethral discharge would be examined.  Testing the pH of the vagina with a test tape.  Using a vaginal swab test that checks for the Trichomonas organism. A test is available that provides results within a few minutes.  Doing a culture test for the organism. This is not usually needed. TREATMENT   You may be given medicine to fight the infection. Women should inform their health care provider if they could be or are pregnant. Some medicines used to treat  the infection should not be taken during pregnancy.  Your health care provider may recommend over-the-counter medicines or creams to decrease itching or irritation.  Your sexual partner will need to be treated if infected. HOME CARE INSTRUCTIONS   Take medicines only as directed by your health care provider.  Take over-the-counter medicine for itching or irritation as directed by your health care provider.  Do not have sexual intercourse while you have the infection.  Women should not douche or wear tampons while they have the infection.  Discuss your infection with your partner. Your partner may have gotten the infection from you, or you may have gotten it from your partner.  Have your sex partner get examined and treated if necessary.  Practice safe, informed, and protected sex.  See your health care provider for other STI testing. SEEK MEDICAL CARE IF:   You still have symptoms after you finish your medicine.  You develop abdominal pain.  You have pain when you urinate.  You have bleeding after sexual intercourse.  You develop a rash.  Your medicine makes you sick or makes you  throw up (vomit). MAKE SURE YOU:  Understand these instructions.  Will watch your condition.  Will get help right away if you are not doing well or get worse. Document Released: 10/06/2000 Document Revised: 08/27/2013 Document Reviewed: 01/22/2013 [facility] Patient Information 2015 [place], [place]. This information is not intended to replace advice given to you by your health care provider. Make sure you discuss any questions you have with your health care provider.

## 2014-02-05 LAB — GC/CHLAMYDIA PROBE AMP
CT Probe RNA: NEGATIVE
GC Probe RNA: NEGATIVE

## 2014-02-05 NOTE — ED Provider Notes (Signed)
Medical screening examination/treatment/procedure(s) were performed by non-physician practitioner and as supervising physician I was immediately available for consultation/collaboration.   EKG Interpretation None       Charlesetta Shanks, MD 02/05/14 [phone]

## 2014-03-23 ENCOUNTER — Encounter (HOSPITAL_BASED_OUTPATIENT_CLINIC_OR_DEPARTMENT_OTHER): Payer: Self-pay | Admitting: Emergency Medicine

## 2014-03-23 ENCOUNTER — Emergency Department (HOSPITAL_BASED_OUTPATIENT_CLINIC_OR_DEPARTMENT_OTHER)
Admission: EM | Admit: 2014-03-23 | Discharge: 2014-03-23 | Disposition: A | Discharge status: Home / Self Care | Payer: Self-pay | Attending: Emergency Medicine | Admitting: Emergency Medicine

## 2014-03-23 DIAGNOSIS — Z720 Tobacco use: Secondary | ICD-10-CM | POA: Insufficient documentation

## 2014-03-23 DIAGNOSIS — Z8619 Personal history of other infectious and parasitic diseases: Secondary | ICD-10-CM | POA: Insufficient documentation

## 2014-03-23 DIAGNOSIS — L0291 Cutaneous abscess, unspecified: Secondary | ICD-10-CM

## 2014-03-23 DIAGNOSIS — Z79899 Other long term (current) drug therapy: Secondary | ICD-10-CM | POA: Insufficient documentation

## 2014-03-23 DIAGNOSIS — M65051 Abscess of tendon sheath, right thigh: Secondary | ICD-10-CM | POA: Insufficient documentation

## 2014-03-23 DIAGNOSIS — Z86018 Personal history of other benign neoplasm: Secondary | ICD-10-CM | POA: Insufficient documentation

## 2014-03-23 DIAGNOSIS — Z8709 Personal history of other diseases of the respiratory system: Secondary | ICD-10-CM | POA: Insufficient documentation

## 2014-03-23 DIAGNOSIS — Z8742 Personal history of other diseases of the female genital tract: Secondary | ICD-10-CM | POA: Insufficient documentation

## 2014-03-23 MED ORDER — LIDOCAINE HCL (PF) 1 % IJ SOLN
5.0000 mL | Freq: Once | INTRAMUSCULAR | Status: DC
  Filled 2014-03-23: qty 5

## 2014-03-23 MED ORDER — HYDROCODONE-ACETAMINOPHEN 5-325 MG PO TABS
1.0000 | ORAL_TABLET | Freq: Once | ORAL | Status: AC
  Administered 2014-03-23: 1 via ORAL
  Filled 2014-03-23: qty 1

## 2014-03-23 NOTE — Discharge Instructions (Signed)
Please follow the directions provided.  Be sure to follow-up with a primary care provider, urgent care or in the emergency department in 2 days for a wound re-check.  Use warm soaks daily to help encourage the wound to continue to drain.  Don't hesitate to return for any new, worsening or concerning symptoms.     SEEK MEDICAL CARE IF:  You have increased pain, swelling, redness, fluid drainage, or bleeding.  You have muscle aches, chills, or a general ill feeling.  You have a fever.

## 2014-03-23 NOTE — ED Provider Notes (Signed)
CSN: [id number]     Arrival date & time 03/23/14  1103 History   First MD Initiated Contact with Patient 03/23/14 1246     Chief Complaint  Patient presents with  . Abscess    (Consider location/radiation/quality/duration/timing/severity/associated sxs/prior Treatment) HPI  Laura Vargas is a 23 yo female presenting with an abscess on the medial aspect of her upper right thigh.  She reports she noticed it 4 days ago and it is warm, red and painful.  The pain is worse with palpation and she rates it 10/10.  She denies any red streaking, fevers, nausea or vomiting.    Past Medical History  Diagnosis Date  . Ovarian cyst   . Bronchitis   . Shingles   . Breast cyst   . Dysmenorrhea   . Fibroids    Past Surgical History  Procedure Laterality Date  . Breast lumpectomy     Family History  Problem Relation Age of Onset  . Hypertension Father    History  Substance Use Topics  . Smoking status: Current Every Day Smoker -- 0.50 packs/day for 6 years    Types: Cigarettes  . Smokeless tobacco: Never Used  . Alcohol Use: No   OB History    Gravida Para Term Preterm AB TAB SAB Ectopic Multiple Living   0              Review of Systems  Constitutional: Negative for fever and chills.  Eyes: Negative for visual disturbance.  Respiratory: Negative for shortness of breath.   Cardiovascular: Negative for chest pain and leg swelling.  Gastrointestinal: Negative for nausea and vomiting.  Musculoskeletal: Negative for myalgias.  Skin: Positive for rash.  Neurological: Negative for weakness, numbness and headaches.      Allergies  Review of patient's allergies indicates no known allergies.  Home Medications   Prior to Admission medications   Medication Sig Start Date End Date Taking? Authorizing Provider  chlorpheniramine-HYDROcodone (TUSSIONEX PENNKINETIC ER) 10-8 MG/5ML LQCR Take 5 mLs by mouth every 12 (twelve) hours as needed. 05/30/13   Roselee Culver, MD    metroNIDAZOLE (FLAGYL) 500 MG tablet Take 1 tablet (500 mg total) by mouth 2 (two) times daily. One po bid x 7 days 02/04/14   Carman Ching, PA-C  oseltamivir (TAMIFLU) 75 MG capsule Take 1 capsule (75 mg total) by mouth 2 (two) times daily. 05/30/13   Roselee Culver, MD  pseudoephedrine-guaifenesin (MUCINEX D) 60-600 MG per tablet Take 1 tablet by mouth every 12 (twelve) hours. 05/30/13 05/30/14  Roselee Culver, MD   BP 137/88 mmHg  Pulse 122  Temp(Src) 98.3 F (36.8 C) (Oral)  Resp 20  Ht 5\' 6"  (1.676 m)  Wt 240 lb (108.863 kg)  BMI 38.76 kg/m2  SpO2 100%  LMP 01/24/2014 Physical Exam  Constitutional: She appears well-developed and well-nourished. No distress.  HENT:  Head: Normocephalic and atraumatic.  Eyes: Conjunctivae are normal.  Neck: Neck supple.  Cardiovascular: Normal rate, regular rhythm and intact distal pulses.   Pulmonary/Chest: Effort normal and breath sounds normal. No respiratory distress.  Abdominal: Soft. There is no tenderness.  Neurological: She is alert.  Skin: Skin is warm and dry. Rash noted. She is not diaphoretic. There is erythema.     Nursing note and vitals reviewed.   ED Course  Procedures (including critical care time) INCISION AND DRAINAGE Performed by: Britt Bottom Consent: Verbal consent obtained. Risks and benefits: risks, benefits and alternatives were discussed Type: abscess  Body area: right medial thigh  Anesthesia: local infiltration  Incision was made with a scalpel.  Local anesthetic: lidocaine 1 % without epinephrine  Anesthetic total: 1 ml  Complexity: complex Blunt dissection to break up loculations  Drainage: purulent  Drainage amount: moderate  Packing material: none  Patient tolerance: Patient tolerated the procedure well with no immediate complications.   Labs Review Labs Reviewed - No data to display  Imaging Review No results found.   EKG Interpretation None      MDM   Final  diagnoses:  Abscess   23 yo with skin abscess amenable to incision and drainage.  Abscess was large enough to benefit from  Draining.  Discussed with pt the need for wound recheck in 2 days. Encouraged home warm soaks and flushing.  No signs of cellulitis in surrounding skin.  Will d/c to home.  No antibiotic therapy is indicated.  Pt is well-appearing, in no acute distress and vital signs are stable.  They appear safe to be discharged.  Pt aware of plan and in agreement.  Return precautions provided.    Filed Vitals:   03/23/14 1108 03/23/14 1349  BP: 137/88 120/83  Pulse: 122 87  Temp: 98.3 F (36.8 C)   TempSrc: Oral   Resp: 20 20  Height: 5\' 6"  (1.676 m)   Weight: 240 lb (108.863 kg)   SpO2: 100% 100%   Meds given in ED:  Medications  HYDROcodone-acetaminophen (NORCO/VICODIN) 5-325 MG per tablet 1 tablet (1 tablet Oral Given 03/23/14 1256)    Discharge Medication List as of 03/23/2014  1:43 PM         Britt Bottom, NP 03/25/14 [place], MD 03/26/14 1426

## 2014-03-23 NOTE — ED Notes (Signed)
PT presents to ED with complaints of abscess to right inner thigh.

## 2014-12-15 ENCOUNTER — Emergency Department (HOSPITAL_BASED_OUTPATIENT_CLINIC_OR_DEPARTMENT_OTHER): Payer: No Typology Code available for payment source

## 2014-12-15 ENCOUNTER — Emergency Department (HOSPITAL_BASED_OUTPATIENT_CLINIC_OR_DEPARTMENT_OTHER)
Admission: EM | Admit: 2014-12-15 | Discharge: 2014-12-15 | Disposition: A | Discharge status: Home / Self Care | Payer: No Typology Code available for payment source | Attending: Emergency Medicine | Admitting: Emergency Medicine

## 2014-12-15 ENCOUNTER — Encounter (HOSPITAL_BASED_OUTPATIENT_CLINIC_OR_DEPARTMENT_OTHER): Payer: Self-pay | Admitting: *Deleted

## 2014-12-15 DIAGNOSIS — S199XXA Unspecified injury of neck, initial encounter: Secondary | ICD-10-CM | POA: Diagnosis present

## 2014-12-15 DIAGNOSIS — R202 Paresthesia of skin: Secondary | ICD-10-CM | POA: Insufficient documentation

## 2014-12-15 DIAGNOSIS — S4992XA Unspecified injury of left shoulder and upper arm, initial encounter: Secondary | ICD-10-CM | POA: Diagnosis not present

## 2014-12-15 DIAGNOSIS — Y9389 Activity, other specified: Secondary | ICD-10-CM | POA: Diagnosis not present

## 2014-12-15 DIAGNOSIS — S161XXA Strain of muscle, fascia and tendon at neck level, initial encounter: Secondary | ICD-10-CM

## 2014-12-15 DIAGNOSIS — Y92410 Unspecified street and highway as the place of occurrence of the external cause: Secondary | ICD-10-CM | POA: Insufficient documentation

## 2014-12-15 DIAGNOSIS — Z79899 Other long term (current) drug therapy: Secondary | ICD-10-CM | POA: Diagnosis not present

## 2014-12-15 DIAGNOSIS — Z8742 Personal history of other diseases of the female genital tract: Secondary | ICD-10-CM | POA: Insufficient documentation

## 2014-12-15 DIAGNOSIS — Z8709 Personal history of other diseases of the respiratory system: Secondary | ICD-10-CM | POA: Insufficient documentation

## 2014-12-15 DIAGNOSIS — Y998 Other external cause status: Secondary | ICD-10-CM | POA: Diagnosis not present

## 2014-12-15 DIAGNOSIS — Z8619 Personal history of other infectious and parasitic diseases: Secondary | ICD-10-CM | POA: Insufficient documentation

## 2014-12-15 DIAGNOSIS — Z86018 Personal history of other benign neoplasm: Secondary | ICD-10-CM | POA: Diagnosis not present

## 2014-12-15 DIAGNOSIS — S4991XA Unspecified injury of right shoulder and upper arm, initial encounter: Secondary | ICD-10-CM | POA: Diagnosis not present

## 2014-12-15 DIAGNOSIS — Z720 Tobacco use: Secondary | ICD-10-CM | POA: Diagnosis not present

## 2014-12-15 MED ORDER — CYCLOBENZAPRINE HCL 10 MG PO TABS: 10.0000 mg | ORAL_TABLET | Freq: Three times a day (TID) | ORAL | Status: DC | PRN
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
## 2015-02-17 ENCOUNTER — Encounter (HOSPITAL_BASED_OUTPATIENT_CLINIC_OR_DEPARTMENT_OTHER): Payer: Self-pay | Admitting: *Deleted

## 2015-02-17 ENCOUNTER — Emergency Department (HOSPITAL_BASED_OUTPATIENT_CLINIC_OR_DEPARTMENT_OTHER): Payer: 59

## 2015-02-17 ENCOUNTER — Emergency Department (HOSPITAL_BASED_OUTPATIENT_CLINIC_OR_DEPARTMENT_OTHER)
Admission: EM | Admit: 2015-02-17 | Discharge: 2015-02-17 | Disposition: A | Discharge status: Home / Self Care | Payer: 59 | Attending: Emergency Medicine | Admitting: Emergency Medicine

## 2015-02-17 DIAGNOSIS — J069 Acute upper respiratory infection, unspecified: Secondary | ICD-10-CM | POA: Diagnosis not present

## 2015-02-17 DIAGNOSIS — Z8619 Personal history of other infectious and parasitic diseases: Secondary | ICD-10-CM | POA: Insufficient documentation

## 2015-02-17 DIAGNOSIS — Z86018 Personal history of other benign neoplasm: Secondary | ICD-10-CM | POA: Diagnosis not present

## 2015-02-17 DIAGNOSIS — B9789 Other viral agents as the cause of diseases classified elsewhere: Secondary | ICD-10-CM

## 2015-02-17 DIAGNOSIS — R0602 Shortness of breath: Secondary | ICD-10-CM | POA: Diagnosis present

## 2015-02-17 DIAGNOSIS — R062 Wheezing: Secondary | ICD-10-CM

## 2015-02-17 DIAGNOSIS — Z720 Tobacco use: Secondary | ICD-10-CM | POA: Diagnosis not present

## 2015-02-17 DIAGNOSIS — Z8742 Personal history of other diseases of the female genital tract: Secondary | ICD-10-CM | POA: Diagnosis not present

## 2015-02-17 MED ORDER — BENZONATATE 100 MG PO CAPS: 200.0000 mg | ORAL_CAPSULE | Freq: Two times a day (BID) | ORAL | Status: DC | PRN

## 2015-02-17 MED ORDER — AEROCHAMBER PLUS FLO-VU MEDIUM MISC
1.0000 | Freq: Once | Status: AC
  Administered 2015-02-17: 1
  Filled 2015-02-17: qty 1

## 2015-02-17 MED ORDER — PREDNISONE 10 MG PO TABS
60.0000 mg | ORAL_TABLET | Freq: Once | ORAL | Status: AC
  Administered 2015-02-17: 60 mg via ORAL
  Filled 2015-02-17 (×2): qty 1

## 2015-02-17 MED ORDER — ALBUTEROL SULFATE HFA 108 (90 BASE) MCG/ACT IN AERS
2.0000 | INHALATION_SPRAY | RESPIRATORY_TRACT | Status: DC | PRN
  Administered 2015-02-17: 2 via RESPIRATORY_TRACT
  Filled 2015-02-17: qty 6.7

## 2015-02-17 MED ORDER — ALBUTEROL SULFATE (2.5 MG/3ML) 0.083% IN NEBU
2.5000 mg | INHALATION_SOLUTION | Freq: Once | RESPIRATORY_TRACT | Status: AC
  Administered 2015-02-17: 2.5 mg via RESPIRATORY_TRACT

## 2015-02-17 MED ORDER — ALBUTEROL SULFATE (2.5 MG/3ML) 0.083% IN NEBU
INHALATION_SOLUTION | RESPIRATORY_TRACT | Status: AC
  Administered 2015-02-17: 2.5 mg via RESPIRATORY_TRACT
  Filled 2015-02-17: qty 3

## 2015-02-17 MED ORDER — ALBUTEROL SULFATE (2.5 MG/3ML) 0.083% IN NEBU
5.0000 mg | INHALATION_SOLUTION | Freq: Once | RESPIRATORY_TRACT | Status: AC
  Administered 2015-02-17: 5 mg via RESPIRATORY_TRACT
  Filled 2015-02-17: qty 6

## 2015-02-17 MED ORDER — IPRATROPIUM BROMIDE 0.02 % IN SOLN
0.5000 mg | Freq: Once | RESPIRATORY_TRACT | Status: AC
  Administered 2015-02-17: 0.5 mg via RESPIRATORY_TRACT
  Filled 2015-02-17: qty 2.5

## 2015-02-17 MED ORDER — PREDNISONE 20 MG PO TABS: 40.0000 mg | ORAL_TABLET | Freq: Every day | ORAL | Status: DC

## 2015-02-17 MED ORDER — IPRATROPIUM-ALBUTEROL 0.5-2.5 (3) MG/3ML IN SOLN
RESPIRATORY_TRACT | Status: AC
  Administered 2015-02-17: 3 mL via RESPIRATORY_TRACT
  Filled 2015-02-17: qty 3

## 2015-02-17 MED ORDER — IPRATROPIUM-ALBUTEROL 0.5-2.5 (3) MG/3ML IN SOLN
3.0000 mL | Freq: Once | RESPIRATORY_TRACT | Status: AC
  Administered 2015-02-17: 3 mL via RESPIRATORY_TRACT

## 2015-02-17 NOTE — ED Notes (Signed)
Patient states she has a one week history of coughing and over this past two days the cough is worse and is associated with chest tightness and sob.

## 2015-02-17 NOTE — Discharge Instructions (Signed)
1. Medications: albuterol, prednisone, mucinex, tessalon, usual home medications 2. Treatment: rest, drink plenty of fluids, take tylenol or ibuprofen for fever control 3. Follow Up: Please followup with your primary doctor in 3 days for discussion of your diagnoses and further evaluation after today's visit; if you do not have a primary care doctor use the resource guide provided to find one; Return to the ER for high fevers, difficulty breathing or other concerning symptoms   Upper Respiratory Infection, Adult Most upper respiratory infections (URIs) are a viral infection of the air passages leading to the lungs. A URI affects the nose, throat, and upper air passages. The most common type of URI is nasopharyngitis and is typically referred to as "the common cold." URIs run their course and usually go away on their own. Most of the time, a URI does not require medical attention, but sometimes a bacterial infection in the upper airways can follow a viral infection. This is called a secondary infection. Sinus and middle ear infections are common types of secondary upper respiratory infections. Bacterial pneumonia can also complicate a URI. A URI can worsen asthma and chronic obstructive pulmonary disease (COPD). Sometimes, these complications can require emergency medical care and may be life threatening.  CAUSES Almost all URIs are caused by viruses. A virus is a type of germ and can spread from one person to another.  RISKS FACTORS You may be at risk for a URI if:   You smoke.   You have chronic heart or lung disease.  You have a weakened defense (immune) system.   You are very young or very old.   You have nasal allergies or asthma.  You work in crowded or poorly ventilated areas.  You work in health care facilities or schools. SIGNS AND SYMPTOMS  Symptoms typically develop 2-3 days after you come in contact with a cold virus. Most viral URIs last 7-10 days. However, viral URIs from  the influenza virus (flu virus) can last 14-18 days and are typically more severe. Symptoms may include:   Runny or stuffy (congested) nose.   Sneezing.   Cough.   Sore throat.   Headache.   Fatigue.   Fever.   Loss of appetite.   Pain in your forehead, behind your eyes, and over your cheekbones (sinus pain).  Muscle aches.  DIAGNOSIS  Your health care provider may diagnose a URI by:  Physical exam.  Tests to check that your symptoms are not due to another condition such as:  Strep throat.  Sinusitis.  Pneumonia.  Asthma. TREATMENT  A URI goes away on its own with time. It cannot be cured with medicines, but medicines may be prescribed or recommended to relieve symptoms. Medicines may help:  Reduce your fever.  Reduce your cough.  Relieve nasal congestion. HOME CARE INSTRUCTIONS   Take medicines only as directed by your health care provider.   Gargle warm saltwater or take cough drops to comfort your throat as directed by your health care provider.  Use a warm mist humidifier or inhale steam from a shower to increase air moisture. This may make it easier to breathe.  Drink enough fluid to keep your urine clear or pale yellow.   Eat soups and other clear broths and maintain good nutrition.   Rest as needed.   Return to work when your temperature has returned to normal or as your health care provider advises. You may need to stay home longer to avoid infecting others. You can also  use a face mask and careful hand washing to prevent spread of the virus.  Increase the usage of your inhaler if you have asthma.   Do not use any tobacco products, including cigarettes, chewing tobacco, or electronic cigarettes. If you need help quitting, ask your health care provider. PREVENTION  The best way to protect yourself from getting a cold is to practice good hygiene.   Avoid oral or hand contact with people with cold symptoms.   Wash your hands often  if contact occurs.  There is no clear evidence that vitamin C, vitamin E, echinacea, or exercise reduces the chance of developing a cold. However, it is always recommended to get plenty of rest, exercise, and practice good nutrition.  SEEK MEDICAL CARE IF:   You are getting worse rather than better.   Your symptoms are not controlled by medicine.   You have chills.  You have worsening shortness of breath.  You have brown or red mucus.  You have yellow or brown nasal discharge.  You have pain in your face, especially when you bend forward.  You have a fever.  You have swollen neck glands.  You have pain while swallowing.  You have white areas in the back of your throat. SEEK IMMEDIATE MEDICAL CARE IF:   You have severe or persistent:  Headache.  Ear pain.  Sinus pain.  Chest pain.  You have chronic lung disease and any of the following:  Wheezing.  Prolonged cough.  Coughing up blood.  A change in your usual mucus.  You have a stiff neck.  You have changes in your:  Vision.  Hearing.  Thinking.  Mood. MAKE SURE YOU:   Understand these instructions.  Will watch your condition.  Will get help right away if you are not doing well or get worse.   This information is not intended to replace advice given to you by your health care provider. Make sure you discuss any questions you have with your health care provider.   Document Released: 10/06/2000 Document Revised: 08/27/2014 Document Reviewed: 07/18/2013 Elsevier Interactive Patient Education 2016 [employer].    Emergency Department Resource Guide 1) Find a Doctor and Pay Out of Pocket Although you won't have to find out who is covered by your insurance plan, it is a good idea to ask around and get recommendations. You will then need to call the office and see if the doctor you have chosen will accept you as a new patient and what types of options they offer for patients who are self-pay.  Some doctors offer discounts or will set up payment plans for their patients who do not have insurance, but you will need to ask so you aren't surprised when you get to your appointment.  2) Contact Your Local Health Department Not all health departments have doctors that can see patients for sick visits, but many do, so it is worth a call to see if yours does. If you don't know where your local health department is, you can check in your phone book. The CDC also has a tool to help you locate your state's health department, and many state websites also have listings of all of their local health departments.  3) Find a [facility] If your illness is not likely to be very severe or complicated, you may want to try a walk in clinic. These are popping up all over the country in pharmacies, drugstores, and shopping centers. They're usually staffed by nurse practitioners or  physician assistants that have been trained to treat common illnesses and complaints. They're usually fairly quick and inexpensive. However, if you have serious medical issues or chronic medical problems, these are probably not your best option.  No Primary Care Doctor: - Call Health Connect at  [phone] - they can help you locate a primary care doctor that  accepts your insurance, provides certain services, etc. - Physician Referral Service- [phone]  Chronic Pain Problems: Organization         Address  Phone   Notes  [facility]  [phone] Patients need to be referred by their primary care doctor.   Medication Assistance: Organization         Address  Phone   Notes  [facility] Medication [facility] [address] [place], [place] [address] [phone] --Must be a resident of [facility] -- Must have NO insurance coverage whatsoever (no Medicaid/ Medicare, etc.) -- The pt. MUST have a primary care doctor that directs their care regularly and follows them in the  community   MedAssist  [phone]   [employer]  [phone]    Agencies that provide inexpensive medical care: Organization         Address  Phone   Notes  [place]  [phone]   [facility]    [phone]   [facility] [place], [place] [address] [phone]   [facility] [address], [place] [phone]   Planned Parenthood    [phone]   [facility]    [phone]   [place] and [place] [address], [place] Phone:  [phone], Fax:  [phone] Hours of Operation:  9 am - 6 pm, M-F.  Also accepts Medicaid/Medicare and self-pay.  [facility] for [place], [address], [place] Phone: [phone], Fax: [phone]. Hours of Operation:  8:30 am - 5:30 pm, M-F.  Also accepts Medicaid and self-pay.  [facility] [place] [address], [place] Phone: [phone]   [place], [place], [place] [phone], Ext. [phone] Mondays & Thursdays: 7-9 AM.  First 15 patients are seen on a first come, first serve basis.    [place] Providers:  Organization         Address  Phone   Notes  [facility] [address], [place] [phone] Also accepts self-pay patients.  [facility] [address], [place]  [phone]   [place], [address], [place] [phone]   [facility] [address], [place] [phone]   Lucianne Lei [address], [place]   [phone] Only accepts [place] Access [place] patients after they have their name applied to their card.   Self-Pay (no insurance) in [facility]:  Organization         Address  Phone   Notes  Sickle Cell Patients, [facility]  Internal Medicine [place] [phone]   [facility] [place] [phone]   [facility] [place]  [address], [place] [phone]   [facility]/Dr. Vista Lawman  [address], [place] or [address], [place] [phone] Phone number for both [place] and [place] locations is the same.  [facility]gent [facility] and [facility] [address] [place] [phone]   [facility] [address] [place] or [address] [phone]   [facility] [address] [place] [phone], phone; [phone], fax Sees patients 1st and 3rd Saturday of every month.  Must not qualify for public or private insurance (i.e. Medicaid, Medicare, [place] Health Choice, Veterans' Benefits)  Household income should be no more than 200% of the poverty level The clinic cannot treat you if you are pregnant or think you are pregnant  Sexually transmitted diseases are not treated at the clinic.    Dental Care: Organization         Address  Phone  Notes  [facility] Department of [facility] [place] [phone] Accepts children up to age 91 who are enrolled in [place] or [place]; pregnant women with a Medicaid card; and children who have applied for Medicaid or [place] Health Choice, but were declined, whose parents can pay a reduced fee at time of service.  [facility] Department of [facility]  [address], [place] [phone] Accepts children up to age 41 who are enrolled in [place] or [place]; pregnant women with a Medicaid card; and children who have applied for Medicaid or [place] Health Choice, but were declined, whose parents can pay a reduced fee at time of service.  [place] Adult [facility] Access PROGRAM  Industry [phone] Patients are seen by appointment only. Walk-ins are not accepted. [place] will see patients 24 years of age and older. Monday - Tuesday (8am-5pm) Most Wednesdays (8:30-5pm) $30 per visit, cash only  [facility] Adult Dental Access PROGRAM  [address], [place] [facility] [employer] [facility] [phone] Patients are seen by appointment only. Walk-ins are not accepted. [place] will see patients 26 years of age and older. One Wednesday Evening (Monthly: Volunteer Based).  $30 per visit, cash only  [place]  [phone] for adults; Children under age 4, call Graduate Pediatric Dentistry at [phone]. Children aged 52-14, please call [phone] to request a pediatric application.  Dental services are provided in all areas of dental care including fillings, crowns and bridges, complete and partial dentures, implants, gum treatment, root canals, and extractions. Preventive care is also provided. Treatment is provided to both adults and children. Patients are selected via a lottery and there is often a waiting list.   [facility] [address] [place]  [phone] [email or web address]   [facility] [address] [place], [place] [phone], Ext. [phone] Second and Fourth Thursday of each month, opens at 6:30 AM; Clinic ends at 9 AM.  Patients are seen on a first-come first-served basis, and a limited number are seen during each clinic.   [facility]  [address] Hillard Danker [place], [place] [phone]   Eligibility Requirements You must have lived in [place], [place], or [place] counties for at least the last three months.   You cannot be eligible for state or federal sponsored [employer], including [employer], [place], or [employer].   You generally cannot be eligible for healthcare insurance through your employer.    How to  apply: Eligibility screenings are held every  Tuesday and Wednesday afternoon from 1:00 pm until 4:00 pm. You do not need an appointment for the interview!  [facility] [address] [place], [place]   [place]  [place] Department  [place]  [phone]    Behavioral Health Resources in the Community: Intensive Outpatient Programs Organization         Address  Phone  Notes  [place]. [address], [place], [place] [phone]   [facility] [address] [place], [place]   ADS: Alcohol & Drug Svcs [address], [place], [place]   [place] [address],  [place], [place] or [phone]   Substance Abuse Resources Organization         Address  Phone  Notes  Alcohol and Drug Services  [phone]   [place]  [phone]   [facility]   Chinita Pester  [phone]   Residential & Outpatient Substance Abuse Program  [phone]   Psychological Services Organization         Address  Phone  Notes  [facility] [place]  [place]  [phone]   [place] [address] [place] or [phone]    Mobile Crisis Teams Organization         Address  Phone  Notes  [facility], Mobile Crisis Care Unit  [phone]   [facility]  [address] [place], [place]   [place] [address], [place] [phone]    Self-Help/Support Groups Organization         Address  Phone             Notes  [place]. of [place] - variety of support groups  [place] Call for more information  Narcotics Anonymous (NA), [facility] [address], [employer] [place]  2 meetings at this location    Special educational needs teacher         Address  Phone  Notes  ASAP Residential Treatment [place],    [place]  [phone]   [facility]  [address], [place] [phone], [place], [place]   [place], [place] [phone] Admissions: 8am-3pm M-F  Incentives Substance [place] [address],    [place], [place] [phone]   [facility] [address] [place], [place], [place]   [facility] [address]  [place], [place]   [facility]ight Programs - Intensive Outpatient [address], [place], [place]   [facility] ([place] [address]  [place], [place] [phone] or [phone]   [facility] Services (RTS) [address], [place], [place] Accepts Medicaid  [facility] [place] [address],  [place] [phone] Substance Abuse/Addiction Treatment   [facility] Organization         Address  Phone  Notes  [facility]  [phone]   Domenic Schwab, PhD [address] Arlis [place], [place]   [phone] or [phone]   [place]   [address] [place], [place] [phone]   [facility] [place] [address] [place],  [place] [phone] Insurance/Medicaid/sponsorship through [facility] and Families [address], [address] [place], [place] [phone] [place], [place] [phone]    Dr. Adele Schilder  [phone]   [facility] Dept. 1) [address] [place] 2) [place] 3)  [place] [address], [place] [phone]  [phone]   [place] [phone] or [phone] (After  Hours)

## 2015-02-17 NOTE — ED Notes (Signed)
Patient transported to X-ray 

## 2015-02-17 NOTE — ED Notes (Signed)
Directed to pharmacy to pick up medications 

## 2015-02-17 NOTE — ED Notes (Signed)
Crystal, RT at bedside to admin breathing tx

## 2015-02-17 NOTE — ED Provider Notes (Signed)
CSN: [id number]     Arrival date & time 02/17/15  1033 History   First MD Initiated Contact with Patient 02/17/15 1046     Chief Complaint  Patient presents with  . Shortness of Breath     (Consider location/radiation/quality/duration/timing/severity/associated sxs/prior Treatment) Patient is a 24 y.o. female presenting with shortness of breath. The history is provided by the patient and medical records. No language interpreter was used.  Shortness of Breath Associated symptoms: cough and wheezing   Associated symptoms: no abdominal pain, no chest pain, no ear pain, no fever, no headaches, no rash, no sore throat and no vomiting      Laura Vargas is a 24 y.o. female  with a hx of ovarian cyst, bronchitis, dysmenorrhea, uterine fibroids presents to the Emergency Department complaining of gradual, persistent, progressively worsening cough, chest congestion and shortness of breath onset 1 week ago. Patient denies sick contacts. She denies and areola, nasal congestion, postnasal drip, otalgia, sore throat. No treatments prior to arrival. She denies a history of asthma but does report a history of bronchitis.  She denies fever, chills, headache, neck pain, chest pain, abdominal pain, nausea, vomiting, diarrhea, weakness, dizziness, syncope.    Past Medical History  Diagnosis Date  . Ovarian cyst   . Bronchitis   . Shingles   . Breast cyst   . Dysmenorrhea   . Fibroids    Past Surgical History  Procedure Laterality Date  . Breast lumpectomy    . Rectal polypectomy     Family History  Problem Relation Age of Onset  . Hypertension Father    Social History  Substance Use Topics  . Smoking status: Current Every Day Smoker -- 0.50 packs/day for 6 years    Types: Cigarettes  . Smokeless tobacco: Never Used  . Alcohol Use: Yes   OB History    Gravida Para Term Preterm AB TAB SAB Ectopic Multiple Living   0              Review of Systems  Constitutional: Negative for fever,  chills, appetite change and fatigue.  HENT: Negative for congestion, ear discharge, ear pain, mouth sores, postnasal drip, rhinorrhea, sinus pressure and sore throat.   Eyes: Negative for visual disturbance.  Respiratory: Positive for cough, chest tightness, shortness of breath and wheezing. Negative for stridor.   Cardiovascular: Negative for chest pain, palpitations and leg swelling.  Gastrointestinal: Negative for nausea, vomiting, abdominal pain and diarrhea.  Genitourinary: Negative for dysuria, urgency, frequency and hematuria.  Musculoskeletal: Negative for myalgias, back pain, arthralgias and neck stiffness.  Skin: Negative for rash.  Neurological: Negative for syncope, light-headedness, numbness and headaches.  Hematological: Negative for adenopathy.  Psychiatric/Behavioral: The patient is not nervous/anxious.   All other systems reviewed and are negative.     Allergies  Review of patient's allergies indicates no known allergies.  Home Medications   Prior to Admission medications   Medication Sig Start Date End Date Taking? Authorizing Provider  benzonatate (TESSALON) 100 MG capsule Take 2 capsules (200 mg total) by mouth 2 (two) times daily as needed for cough. 02/17/15   Shaquanda Graves, PA-C  predniSONE (DELTASONE) 20 MG tablet Take 2 tablets (40 mg total) by mouth daily. 02/17/15   Kyarah Enamorado, PA-C   BP 117/79 mmHg  Pulse 88  Temp(Src) 98.1 F (36.7 C) (Oral)  Resp 28  Ht 5\' 6"  (1.676 m)  Wt 230 lb (104.327 kg)  BMI 37.14 kg/m2  SpO2 100%  LMP 01/28/2015  Physical Exam  Constitutional: She is oriented to person, place, and time. She appears well-developed and well-nourished. No distress.  Awake, alert, nontoxic appearance  HENT:  Head: Normocephalic and atraumatic.  Right Ear: Tympanic membrane, external ear and ear canal normal.  Left Ear: Tympanic membrane, external ear and ear canal normal.  Nose: Mucosal edema and rhinorrhea present. No  epistaxis. Right sinus exhibits no maxillary sinus tenderness and no frontal sinus tenderness. Left sinus exhibits no maxillary sinus tenderness and no frontal sinus tenderness.  Mouth/Throat: Uvula is midline, oropharynx is clear and moist and mucous membranes are normal. Mucous membranes are not pale and not cyanotic. No oropharyngeal exudate, posterior oropharyngeal edema, posterior oropharyngeal erythema or tonsillar abscesses.  Nasal congestion and rhinorrhea noted  Eyes: Conjunctivae are normal. Pupils are equal, round, and reactive to light. No scleral icterus.  Neck: Normal range of motion and full passive range of motion without pain. Neck supple.  Cardiovascular: Normal rate, regular rhythm, normal heart sounds and intact distal pulses.   No murmur heard. Pulmonary/Chest: Effort normal. No stridor. No respiratory distress. She has decreased breath sounds. She has wheezes. She has no rhonchi. She has no rales.  Equal chest expansion Expiratory wheeze with diminished throughout  Abdominal: Soft. Bowel sounds are normal. She exhibits no mass. There is no tenderness. There is no rebound and no guarding.  Musculoskeletal: Normal range of motion. She exhibits no edema.  Lymphadenopathy:    She has no cervical adenopathy.  Neurological: She is alert and oriented to person, place, and time.  Speech is clear and goal oriented Moves extremities without ataxia  Skin: Skin is warm and dry. No rash noted. She is not diaphoretic. No erythema.  Psychiatric: She has a normal mood and affect.  Nursing note and vitals reviewed.   ED Course  Procedures (including critical care time) Labs Review Labs Reviewed - No data to display  Imaging Review Dg Chest 2 View  02/17/2015  CLINICAL DATA:  Cough since last Monday.  Chest tightness. EXAM: CHEST  2 VIEW COMPARISON:  07/01/2011 FINDINGS: The heart size and mediastinal contours are within normal limits. Both lungs are clear. The visualized skeletal  structures are unremarkable. IMPRESSION: No active cardiopulmonary disease. Electronically Signed   By: Kathreen Devoid   On: 02/17/2015 11:26   I have personally reviewed and evaluated these images and lab results as part of my medical decision-making.   EKG Interpretation None      MDM   Final diagnoses:  Viral URI with cough  Wheezing   Lerry Liner presents with cough, chest congestion 1 week. Patient is a current smoker. Diminished breath sounds on exam although patient is currently receiving nebulizer. Will obtain chest x-ray and reassess.  11:30 AM Patient with persistent wheezing will repeat nebulizer. She has received her steroids. Chest x-ray is without acute abnormality including pneumonia, pneumothorax or pulmonary edema.  12:50 PM Patient with improved breath sounds throughout without persistent wheezing. She remains without hypoxia.  No accessory muscle usage. Patient is afebrile without tachycardia or hypotension.  Discussed smoking cessation. Patient will be discharged home with albuterol, Tessalon and prednisone. Conservative therapies including Mucinex discussed.  Symptoms are likely secondary to viral URI. Discussed that antibiotics are not currently indicated. Discussed return precautions including fevers, increased difficult breathing or other concerns. Patient states understanding and is in agreement with plan.  BP 117/79 mmHg  Pulse 88  Temp(Src) 98.1 F (36.7 C) (Oral)  Resp 28  Ht 5\' 6"  (1.676  m)  Wt 230 lb (104.327 kg)  BMI 37.14 kg/m2  SpO2 100%  LMP 01/28/2015   Abigail Butts, PA-C 02/17/15 1259  Merrily Pew, MD 02/18/15 1623

## 2015-02-27 ENCOUNTER — Emergency Department (HOSPITAL_BASED_OUTPATIENT_CLINIC_OR_DEPARTMENT_OTHER)
Admission: EM | Admit: 2015-02-27 | Discharge: 2015-02-27 | Disposition: A | Discharge status: Home / Self Care | Payer: 59 | Attending: Emergency Medicine | Admitting: Emergency Medicine

## 2015-02-27 ENCOUNTER — Encounter (HOSPITAL_BASED_OUTPATIENT_CLINIC_OR_DEPARTMENT_OTHER): Payer: Self-pay | Admitting: *Deleted

## 2015-02-27 ENCOUNTER — Emergency Department (HOSPITAL_BASED_OUTPATIENT_CLINIC_OR_DEPARTMENT_OTHER): Payer: 59

## 2015-02-27 DIAGNOSIS — J0100 Acute maxillary sinusitis, unspecified: Secondary | ICD-10-CM | POA: Diagnosis not present

## 2015-02-27 DIAGNOSIS — Z8619 Personal history of other infectious and parasitic diseases: Secondary | ICD-10-CM | POA: Diagnosis not present

## 2015-02-27 DIAGNOSIS — R059 Cough, unspecified: Secondary | ICD-10-CM

## 2015-02-27 DIAGNOSIS — Z8742 Personal history of other diseases of the female genital tract: Secondary | ICD-10-CM | POA: Insufficient documentation

## 2015-02-27 DIAGNOSIS — Z720 Tobacco use: Secondary | ICD-10-CM | POA: Insufficient documentation

## 2015-02-27 DIAGNOSIS — R0602 Shortness of breath: Secondary | ICD-10-CM | POA: Diagnosis present

## 2015-02-27 DIAGNOSIS — Z86018 Personal history of other benign neoplasm: Secondary | ICD-10-CM | POA: Diagnosis not present

## 2015-02-27 DIAGNOSIS — Z7952 Long term (current) use of systemic steroids: Secondary | ICD-10-CM | POA: Insufficient documentation

## 2015-02-27 DIAGNOSIS — R05 Cough: Secondary | ICD-10-CM

## 2015-02-27 MED ORDER — AMOXICILLIN-POT CLAVULANATE 875-125 MG PO TABS: 1.0000 | ORAL_TABLET | Freq: Two times a day (BID) | ORAL | Status: DC

## 2015-02-27 MED ORDER — ALBUTEROL SULFATE HFA 108 (90 BASE) MCG/ACT IN AERS
1.0000 | INHALATION_SPRAY | RESPIRATORY_TRACT | Status: DC
  Administered 2015-02-27: 2 via RESPIRATORY_TRACT
  Filled 2015-02-27: qty 6.7

## 2015-02-27 MED ORDER — PROMETHAZINE-DM 6.25-15 MG/5ML PO SYRP: 2.5000 mL | ORAL_SOLUTION | Freq: Four times a day (QID) | ORAL | Status: DC | PRN

## 2015-02-27 MED ORDER — IPRATROPIUM-ALBUTEROL 0.5-2.5 (3) MG/3ML IN SOLN
3.0000 mL | Freq: Four times a day (QID) | RESPIRATORY_TRACT | Status: DC
  Administered 2015-02-27: 3 mL via RESPIRATORY_TRACT
  Filled 2015-02-27: qty 3

## 2015-02-27 NOTE — Discharge Instructions (Signed)
Cough, Adult A cough helps to clear your throat and lungs. A cough may last only 2-3 weeks (acute), or it may last longer than 8 weeks (chronic). Many different things can cause a cough. A cough may be a sign of an illness or another medical condition. HOME CARE  Pay attention to any changes in your cough.  Take medicines only as told by your doctor.  If you were prescribed an antibiotic medicine, take it as told by your doctor. Do not stop taking it even if you start to feel better.  Talk with your doctor before you try using a cough medicine.  Drink enough fluid to keep your pee (urine) clear or pale yellow.  If the air is dry, use a cold steam vaporizer or humidifier in your home.  Stay away from things that make you cough at work or at home.  If your cough is worse at night, try using extra pillows to raise your head up higher while you sleep.  Do not smoke, and try not to be around smoke. If you need help quitting, ask your doctor.  Do not have caffeine.  Do not drink alcohol.  Rest as needed. GET HELP IF:  You have new problems (symptoms).  You cough up yellow fluid (pus).  Your cough does not get better after 2-3 weeks, or your cough gets worse.  Medicine does not help your cough and you are not sleeping well.  You have pain that gets worse or pain that is not helped with medicine.  You have a fever.  You are losing weight and you do not know why.  You have night sweats. GET HELP RIGHT AWAY IF:  You cough up blood.  You have trouble breathing.  Your heartbeat is very fast.   This information is not intended to replace advice given to you by your health care provider. Make sure you discuss any questions you have with your health care provider.   Document Released: 12/24/2010 Document Revised: 01/01/2015 Document Reviewed: 06/19/2014 Elsevier Interactive Patient Education 2016 [employer].  Please read attached information. If you experience any new or  worsening signs or symptoms please return to the emergency room for evaluation. Please follow-up with your primary care provider or specialist as discussed. Please use medication prescribed only as directed and discontinue taking if you have any concerning signs or symptoms.

## 2015-02-27 NOTE — ED Notes (Signed)
Pt c/o cough, congestion x 3 weeks,  Was seen here for same, given meds  Not any better

## 2015-02-27 NOTE — ED Notes (Signed)
Cough and sob x 2 weeks. Aching all over, stuffy nose, and headache.

## 2015-02-27 NOTE — ED Provider Notes (Signed)
CSN: [id number]     Arrival date & time 02/27/15  2009 History   First MD Initiated Contact with Patient 02/27/15 2016     Chief Complaint  Patient presents with  . Shortness of Breath   HPI   24 year old female presents today with cough, sinus pressure, shortness of breath. Patient was seen in the ED on 02/17/2015 for similar presentations. At that time patient reports that symptoms have been present for approximately one week and reports cough, congestion and shortness of breath. She reports since last being seen she was taking cough medication, steroids, with the albuterol inhaler at home. She reports that the inhaler provides symptomatic relief but she has recently run out. She reports the other medications did not improve her symptoms. Patient reports that she's had worsening sinus pressure, sinus congestion and a headache over the last week. Patient denies any fever, chills, nausea, vomiting, abdominal pain, chest pain, or any other concerning signs or symptoms. Patient reports she is still smoking but down to 1 cigarette per day. Patient denies any lower extremity swelling or edema, history of recent surgeries, active malignancy, estrogen therapy, prolonged immobilization, history of PE or DVT  Past Medical History  Diagnosis Date  . Ovarian cyst   . Bronchitis   . Shingles   . Breast cyst   . Dysmenorrhea   . Fibroids    Past Surgical History  Procedure Laterality Date  . Breast lumpectomy    . Rectal polypectomy     Family History  Problem Relation Age of Onset  . Hypertension Father    Social History  Substance Use Topics  . Smoking status: Current Every Day Smoker -- 0.50 packs/day for 6 years    Types: Cigarettes  . Smokeless tobacco: Never Used  . Alcohol Use: Yes   OB History    Gravida Para Term Preterm AB TAB SAB Ectopic Multiple Living   0              Review of Systems  All other systems reviewed and are negative.     Allergies  Review of patient's  allergies indicates no known allergies.  Home Medications   Prior to Admission medications   Medication Sig Start Date End Date Taking? Authorizing Provider  amoxicillin-clavulanate (AUGMENTIN) 875-125 MG tablet Take 1 tablet by mouth every 12 (twelve) hours. 02/27/15   Okey Regal, PA-C  benzonatate (TESSALON) 100 MG capsule Take 2 capsules (200 mg total) by mouth 2 (two) times daily as needed for cough. 02/17/15   Hannah Muthersbaugh, PA-C  predniSONE (DELTASONE) 20 MG tablet Take 2 tablets (40 mg total) by mouth daily. 02/17/15   Hannah Muthersbaugh, PA-C  promethazine-dextromethorphan (PROMETHAZINE-DM) 6.25-15 MG/5ML syrup Take 2.5 mLs by mouth 4 (four) times daily as needed for cough. 02/27/15   Kirandeep Fariss, PA-C   BP 138/93 mmHg  Pulse 115  Temp(Src) 98.3 F (36.8 C) (Oral)  Resp 20  Ht 5\' 6"  (1.676 m)  Wt 230 lb (104.327 kg)  BMI 37.14 kg/m2  SpO2 100%  LMP 01/28/2015   Physical Exam  Constitutional: She is oriented to person, place, and time. She appears well-developed and well-nourished.  HENT:  Head: Normocephalic and atraumatic.  Right Ear: Hearing, tympanic membrane, external ear and ear canal normal.  Left Ear: Hearing, tympanic membrane, external ear and ear canal normal.  Nose: Right sinus exhibits maxillary sinus tenderness and frontal sinus tenderness. Left sinus exhibits frontal sinus tenderness.  Mouth/Throat: Uvula is midline, oropharynx is clear and moist  and mucous membranes are normal. No oropharyngeal exudate, posterior oropharyngeal edema, posterior oropharyngeal erythema or tonsillar abscesses.  Eyes: Conjunctivae are normal. Pupils are equal, round, and reactive to light. Right eye exhibits no discharge. Left eye exhibits no discharge. No scleral icterus.  Neck: Normal range of motion. No JVD present. No tracheal deviation present.  Pulmonary/Chest: Effort normal. No stridor. No respiratory distress. She has wheezes. She has no rales. She exhibits no  tenderness.  Abdominal: Soft. She exhibits no distension and no mass. There is no tenderness. There is no rebound and no guarding.  Neurological: She is alert and oriented to person, place, and time. Coordination normal.  Skin: Skin is warm and dry.  Psychiatric: She has a normal mood and affect. Her behavior is normal. Judgment and thought content normal.  Nursing note and vitals reviewed.   ED Course  Procedures (including critical care time) Labs Review Labs Reviewed - No data to display  Imaging Review Dg Chest 2 View  02/27/2015  CLINICAL DATA:  Productive cough and SOB x 2 weeks w/ yellow sputum. No known hx of heart or lung conditions. Smoker x 1 or 2 cigarettes per day. EXAM: CHEST  2 VIEW COMPARISON:  02/17/2015 FINDINGS: Midline trachea.  Normal heart size and mediastinal contours. Sharp costophrenic angles.  No pneumothorax.  Clear lungs. IMPRESSION: No active cardiopulmonary disease. Electronically Signed   By: Abigail Miyamoto M.D.   On: 02/27/2015 21:14   I have personally reviewed and evaluated these images and lab results as part of my medical decision-making.   EKG Interpretation None      MDM   Final diagnoses:  Cough  Acute maxillary sinusitis, recurrence not specified    Labs:  Imaging: DG chest 2 view- no active cardiopulmonary disease  Consults:  Therapeutics: DuoNeb- improved wheeze no longer wheezing  Discharge Meds: Albuterol inhaler, promethazine DM, Augmentin  Assessment/Plan: Patient presents with cough, sinus pressure, shortness of breath. Symptoms have been persistent for greater than 2 weeks, not improved with Tessalon Perles, steroids. Patient reports symptomatic improvement with albuterol, but has run out at home. Patient has symptoms consistent with sinusitis, she will be prescribed Augmentin for this. Patient had normal plain films of her chest showing no signs of pneumonia or bronchitic changes. Patient has a well's score of 1.5 due to elevated  heart rate, and this is highly unlikely to be pulmonary embolism this patient has acute upper respiratory symptoms. She will be prescribed an albuterol inhaler for symptomatic treatment, instructed to use nasal saline rinses, cough medication as needed for symptoms. Patient is afebrile, her vital signs are reassuring, she is 100% oxygenation and appears in no acute respiratory distress.  Patient is given strict return precautions, she verbalizes understanding and agreement for today's plan and no further questions concerns at time of discharge         Okey Regal, PA-C 02/27/15 2142  Okey Regal, PA-C 02/27/15 2143  Veryl Speak, MD 02/27/15 2146

## 2015-05-19 ENCOUNTER — Emergency Department (HOSPITAL_BASED_OUTPATIENT_CLINIC_OR_DEPARTMENT_OTHER)
Admission: EM | Admit: 2015-05-19 | Discharge: 2015-05-19 | Disposition: A | Discharge status: Home / Self Care | Payer: Self-pay | Attending: Emergency Medicine | Admitting: Emergency Medicine

## 2015-05-19 ENCOUNTER — Emergency Department (HOSPITAL_BASED_OUTPATIENT_CLINIC_OR_DEPARTMENT_OTHER): Payer: Self-pay

## 2015-05-19 ENCOUNTER — Encounter (HOSPITAL_BASED_OUTPATIENT_CLINIC_OR_DEPARTMENT_OTHER): Payer: Self-pay

## 2015-05-19 DIAGNOSIS — Z86018 Personal history of other benign neoplasm: Secondary | ICD-10-CM | POA: Insufficient documentation

## 2015-05-19 DIAGNOSIS — Z8619 Personal history of other infectious and parasitic diseases: Secondary | ICD-10-CM | POA: Insufficient documentation

## 2015-05-19 DIAGNOSIS — N83209 Unspecified ovarian cyst, unspecified side: Secondary | ICD-10-CM | POA: Insufficient documentation

## 2015-05-19 DIAGNOSIS — N946 Dysmenorrhea, unspecified: Secondary | ICD-10-CM | POA: Insufficient documentation

## 2015-05-19 DIAGNOSIS — N939 Abnormal uterine and vaginal bleeding, unspecified: Secondary | ICD-10-CM | POA: Insufficient documentation

## 2015-05-19 DIAGNOSIS — Z3202 Encounter for pregnancy test, result negative: Secondary | ICD-10-CM | POA: Insufficient documentation

## 2015-05-19 DIAGNOSIS — D259 Leiomyoma of uterus, unspecified: Secondary | ICD-10-CM

## 2015-05-19 DIAGNOSIS — Z8709 Personal history of other diseases of the respiratory system: Secondary | ICD-10-CM | POA: Insufficient documentation

## 2015-05-19 DIAGNOSIS — F17210 Nicotine dependence, cigarettes, uncomplicated: Secondary | ICD-10-CM | POA: Insufficient documentation

## 2015-05-19 DIAGNOSIS — R102 Pelvic and perineal pain: Secondary | ICD-10-CM

## 2015-05-19 LAB — URINALYSIS, ROUTINE W REFLEX MICROSCOPIC
BILIRUBIN URINE: NEGATIVE
GLUCOSE, UA: NEGATIVE mg/dL
Ketones, ur: NEGATIVE mg/dL
Leukocytes, UA: NEGATIVE
Nitrite: NEGATIVE
PH: 7 (ref 5.0–8.0)
Protein, ur: NEGATIVE mg/dL
SPECIFIC GRAVITY, URINE: 1.024 (ref 1.005–1.030)

## 2015-05-19 LAB — URINE MICROSCOPIC-ADD ON

## 2015-05-19 LAB — WET PREP, GENITAL
SPERM: NONE SEEN
Trich, Wet Prep: NONE SEEN
YEAST WET PREP: NONE SEEN

## 2015-05-19 LAB — PREGNANCY, URINE: Preg Test, Ur: NEGATIVE

## 2015-05-19 MED ORDER — OXYCODONE-ACETAMINOPHEN 5-325 MG PO TABS
ORAL_TABLET | ORAL | Status: AC
  Filled 2015-05-19: qty 1

## 2015-05-19 MED ORDER — OXYCODONE-ACETAMINOPHEN 5-325 MG PO TABS: 1.0000 | ORAL_TABLET | Freq: Four times a day (QID) | ORAL | Status: DC | PRN

## 2015-05-19 MED ORDER — OXYCODONE-ACETAMINOPHEN 5-325 MG PO TABS
1.0000 | ORAL_TABLET | Freq: Once | ORAL | Status: AC
  Administered 2015-05-19: 1 via ORAL

## 2015-05-19 NOTE — ED Notes (Signed)
C/o pelvic pain, vaginal bleeding started yesterday

## 2015-05-19 NOTE — ED Notes (Signed)
MD at bedside. 

## 2015-05-19 NOTE — Discharge Instructions (Signed)
Return to the ED with any concerns including vaginal bleeding and soaking more than one pad per hour, vomiting and not able to keep down liquids, fainting, dizziness upon standing, decreased level of alertness/lethargy, or any other alarming symptoms

## 2015-05-19 NOTE — ED Provider Notes (Signed)
CSN: [id number]     Arrival date & time 05/19/15  1515 History   First MD Initiated Contact with Patient 05/19/15 1534     Chief Complaint  Patient presents with  . Pelvic Pain     (Consider location/radiation/quality/duration/timing/severity/associated sxs/prior Treatment) HPI  Pt c/o pelvic pain and vaginal bleeding.  Symptoms began yesterday.  She has a hx of fibroids and ovarian cyst.  Pain is constant and cramping. She states that her menses started yesterday, she has hx of painful periods but states this pain is worse than her usual.  No fever/chills.  She states she feels nauseated and hasn't been able to eat or drink liquids.  No dysuria.  She has not been passing clots of blood.  Has changed 6 pads today.  Has been taking aleve without much relief today.  There are no other associated systemic symptoms, there are no other alleviating or modifying factors.   Past Medical History  Diagnosis Date  . Ovarian cyst   . Bronchitis   . Shingles   . Breast cyst   . Dysmenorrhea   . Fibroids    Past Surgical History  Procedure Laterality Date  . Breast lumpectomy    . Rectal polypectomy     Family History  Problem Relation Age of Onset  . Hypertension Father    Social History  Substance Use Topics  . Smoking status: Current Every Day Smoker -- 0.50 packs/day for 6 years    Types: Cigarettes  . Smokeless tobacco: Never Used  . Alcohol Use: Yes     Comment: occ   OB History    Gravida Para Term Preterm AB TAB SAB Ectopic Multiple Living   0              Review of Systems  ROS reviewed and all otherwise negative except for mentioned in HPI    Allergies  Review of patient's allergies indicates no known allergies.  Home Medications   Prior to Admission medications   Medication Sig Start Date End Date Taking? Authorizing Provider  oxyCODONE-acetaminophen (PERCOCET/ROXICET) 5-325 MG tablet Take 1-2 tablets by mouth every 6 (six) hours as needed for severe pain. 05/19/15    Alfonzo Beers, MD   BP 102/51 mmHg  Pulse 70  Temp(Src) 98.6 F (37 C) (Oral)  Resp 16  Ht 5\' 6"  (1.676 m)  Wt 240 lb (108.863 kg)  BMI 38.76 kg/m2  SpO2 100%  LMP 05/18/2015  Vitals reviewed Physical Exam  Physical Examination: General appearance - alert, well appearing, and in no distress Mental status - alert, oriented to person, place, and time Eyes - no conjunctival injection no scleral icterus Mouth - mucous membranes moist, pharynx normal without lesions Chest - clear to auscultation, no wheezes, rales or rhonchi, symmetric air entry Heart - normal rate, regular rhythm, normal S1, S2, no murmurs, rubs, clicks or gallops Abdomen - soft, nontender, nondistended, no masses or organomegaly Pelvic - normal external genitalia, vulva, vagina, moderate blood in vaginal vault, no masses palpated, pt with significant tenderness in bilateral adnexa Neurological - alert, oriented, normal speech Extremities - peripheral pulses normal, no pedal edema, no clubbing or cyanosis Skin - normal coloration and turgor, no rashes  ED Course  Procedures (including critical care time) Labs Review Labs Reviewed  WET PREP, GENITAL - Abnormal; Notable for the following:    Clue Cells Wet Prep HPF POC PRESENT (*)    WBC, Wet Prep HPF POC FEW (*)    All other components  within normal limits  URINALYSIS, ROUTINE W REFLEX MICROSCOPIC (NOT AT [facility]) - Abnormal; Notable for the following:    Hgb urine dipstick LARGE (*)    All other components within normal limits  URINE MICROSCOPIC-ADD ON - Abnormal; Notable for the following:    Squamous Epithelial / LPF 0-5 (*)    Bacteria, UA RARE (*)    All other components within normal limits  PREGNANCY, URINE  GC/CHLAMYDIA PROBE AMP ([place]) NOT AT [facility]    Imaging Review US Transvaginal Non-ob  05/19/2015  CLINICAL DATA:  Pelvic pain for 2 days. EXAM: TRANSABDOMINAL AND TRANSVAGINAL ULTRASOUND OF PELVIS TECHNIQUE: Both transabdominal and  transvaginal ultrasound examinations of the pelvis were performed. Transabdominal technique was performed for global imaging of the pelvis including uterus, ovaries, adnexal regions, and pelvic cul-de-sac. It was necessary to proceed with endovaginal exam following the transabdominal exam to visualize the endometrium and ovaries. COMPARISON:  None FINDINGS: Uterus Measurements: 6.5 x 4.2 x 5.1 cm. 2.5 x 2.7 x 2.7 cm hypoechoic mass in the uterine fundus most consistent with a fibroid. Endometrium Thickness: 7 mm.  No focal abnormality visualized. Right ovary Measurements: 4.3 x 2.5 x 2 cm. Normal appearance/no adnexal mass. Left ovary Measurements: 3.6 x 2 x 2.6 cm. Normal appearance/no adnexal mass. Other findings Mildly complicated moderate amount of pelvic free fluid. IMPRESSION: 1. Moderate amount of mildly complicated pelvic free fluid which may reflect blood products. This appearance can be seen with a ruptured hemorrhagic cyst. 2. Uterine fundal fibroid. Electronically Signed   By: Kathreen Devoid   On: 05/19/2015 18:05   US Pelvis Complete  05/19/2015  CLINICAL DATA:  Pelvic pain for 2 days. EXAM: TRANSABDOMINAL AND TRANSVAGINAL ULTRASOUND OF PELVIS TECHNIQUE: Both transabdominal and transvaginal ultrasound examinations of the pelvis were performed. Transabdominal technique was performed for global imaging of the pelvis including uterus, ovaries, adnexal regions, and pelvic cul-de-sac. It was necessary to proceed with endovaginal exam following the transabdominal exam to visualize the endometrium and ovaries. COMPARISON:  None FINDINGS: Uterus Measurements: 6.5 x 4.2 x 5.1 cm. 2.5 x 2.7 x 2.7 cm hypoechoic mass in the uterine fundus most consistent with a fibroid. Endometrium Thickness: 7 mm.  No focal abnormality visualized. Right ovary Measurements: 4.3 x 2.5 x 2 cm. Normal appearance/no adnexal mass. Left ovary Measurements: 3.6 x 2 x 2.6 cm. Normal appearance/no adnexal mass. Other findings Mildly  complicated moderate amount of pelvic free fluid. IMPRESSION: 1. Moderate amount of mildly complicated pelvic free fluid which may reflect blood products. This appearance can be seen with a ruptured hemorrhagic cyst. 2. Uterine fundal fibroid. Electronically Signed   By: Kathreen Devoid   On: 05/19/2015 18:05   I have personally reviewed and evaluated these images and lab results as part of my medical decision-making.   EKG Interpretation None      MDM   Final diagnoses:  Dysmenorrhea  Hemorrhagic ovarian cyst  Vaginal bleeding    Pt presenting with pelvic pain and vaginal bleeding.  Ultrasound shows fibroid and ruptured hemorrhagic cyst.  Pt treated with percocet.  No signs of anemia- pt is not tachycardic, no dizziness upon standing or fainting.  Advised to continue with alleve or ibuprofen at home- also given rx for percocet for pain.  Advised close f/u with OB/GYn.  Discharged with strict return precautions.  Pt agreeable with plan.    Alfonzo Beers, MD 05/19/15 [id number]

## 2015-05-20 ENCOUNTER — Encounter (HOSPITAL_BASED_OUTPATIENT_CLINIC_OR_DEPARTMENT_OTHER): Payer: Self-pay

## 2015-05-20 ENCOUNTER — Emergency Department (HOSPITAL_BASED_OUTPATIENT_CLINIC_OR_DEPARTMENT_OTHER)
Admission: EM | Admit: 2015-05-20 | Discharge: 2015-05-20 | Discharge status: Against Medical Advice | Payer: Self-pay | Attending: Emergency Medicine | Admitting: Emergency Medicine

## 2015-05-20 DIAGNOSIS — R109 Unspecified abdominal pain: Secondary | ICD-10-CM | POA: Insufficient documentation

## 2015-05-20 DIAGNOSIS — F17210 Nicotine dependence, cigarettes, uncomplicated: Secondary | ICD-10-CM | POA: Insufficient documentation

## 2015-05-20 LAB — GC/CHLAMYDIA PROBE AMP (~~LOC~~) NOT AT ARMC
Chlamydia: NEGATIVE
NEISSERIA GONORRHEA: NEGATIVE

## 2015-05-20 NOTE — ED Notes (Signed)
Pt was seen yesterday and states her pain is not being controlled with the percocet that was prescribed to her.  No changes or worsening of condition, states she "can't sleep, these percocet aren't doing anything."

## 2015-05-20 NOTE — ED Notes (Signed)
No answer, pt not in waiting room

## 2015-05-20 NOTE — ED Notes (Signed)
Pt did not answer for call to room, not in waiting room

## 2015-05-20 NOTE — ED Notes (Signed)
Pt not in waiting room, did not answer call for room

## 2015-05-22 ENCOUNTER — Emergency Department (HOSPITAL_BASED_OUTPATIENT_CLINIC_OR_DEPARTMENT_OTHER)
Admission: EM | Admit: 2015-05-22 | Discharge: 2015-05-22 | Disposition: A | Discharge status: Home / Self Care | Payer: Self-pay | Attending: Emergency Medicine | Admitting: Emergency Medicine

## 2015-05-22 ENCOUNTER — Encounter (HOSPITAL_BASED_OUTPATIENT_CLINIC_OR_DEPARTMENT_OTHER): Payer: Self-pay | Admitting: *Deleted

## 2015-05-22 DIAGNOSIS — N760 Acute vaginitis: Secondary | ICD-10-CM | POA: Insufficient documentation

## 2015-05-22 DIAGNOSIS — Z86018 Personal history of other benign neoplasm: Secondary | ICD-10-CM | POA: Insufficient documentation

## 2015-05-22 DIAGNOSIS — F17210 Nicotine dependence, cigarettes, uncomplicated: Secondary | ICD-10-CM | POA: Insufficient documentation

## 2015-05-22 DIAGNOSIS — B9689 Other specified bacterial agents as the cause of diseases classified elsewhere: Secondary | ICD-10-CM

## 2015-05-22 DIAGNOSIS — R1030 Lower abdominal pain, unspecified: Secondary | ICD-10-CM

## 2015-05-22 DIAGNOSIS — Z8619 Personal history of other infectious and parasitic diseases: Secondary | ICD-10-CM | POA: Insufficient documentation

## 2015-05-22 DIAGNOSIS — Z3202 Encounter for pregnancy test, result negative: Secondary | ICD-10-CM | POA: Insufficient documentation

## 2015-05-22 LAB — CBC WITH DIFFERENTIAL/PLATELET
Basophils Absolute: 0 10*3/uL (ref 0.0–0.1)
Basophils Relative: 0 %
EOS ABS: 0.2 10*3/uL (ref 0.0–0.7)
Eosinophils Relative: 2 %
HEMATOCRIT: 38.7 % (ref 36.0–46.0)
HEMOGLOBIN: 12.5 g/dL (ref 12.0–15.0)
LYMPHS ABS: 3.4 10*3/uL (ref 0.7–4.0)
LYMPHS PCT: 33 %
MCH: 28.5 pg (ref 26.0–34.0)
MCHC: 32.3 g/dL (ref 30.0–36.0)
MCV: 88.4 fL (ref 78.0–100.0)
Monocytes Absolute: 0.5 10*3/uL (ref 0.1–1.0)
Monocytes Relative: 5 %
NEUTROS ABS: 6.3 10*3/uL (ref 1.7–7.7)
NEUTROS PCT: 60 %
Platelets: 350 10*3/uL (ref 150–400)
RBC: 4.38 MIL/uL (ref 3.87–5.11)
RDW: 13.1 % (ref 11.5–15.5)
WBC: 10.3 10*3/uL (ref 4.0–10.5)

## 2015-05-22 LAB — URINE MICROSCOPIC-ADD ON: WBC, UA: NONE SEEN WBC/hpf (ref 0–5)

## 2015-05-22 LAB — URINALYSIS, ROUTINE W REFLEX MICROSCOPIC
Bilirubin Urine: NEGATIVE
GLUCOSE, UA: NEGATIVE mg/dL
Ketones, ur: NEGATIVE mg/dL
Leukocytes, UA: NEGATIVE
Nitrite: NEGATIVE
PH: 5.5 (ref 5.0–8.0)
Protein, ur: NEGATIVE mg/dL
SPECIFIC GRAVITY, URINE: 1.031 — AB (ref 1.005–1.030)

## 2015-05-22 LAB — PREGNANCY, URINE: Preg Test, Ur: NEGATIVE

## 2015-05-22 MED ORDER — METRONIDAZOLE 500 MG PO TABS
500.0000 mg | ORAL_TABLET | Freq: Once | ORAL | Status: AC
  Administered 2015-05-22: 500 mg via ORAL
  Filled 2015-05-22: qty 1

## 2015-05-22 MED ORDER — METRONIDAZOLE 500 MG PO TABS: 500.0000 mg | ORAL_TABLET | Freq: Two times a day (BID) | ORAL | Status: DC

## 2015-05-22 MED ORDER — KETOROLAC TROMETHAMINE 30 MG/ML IJ SOLN
30.0000 mg | Freq: Once | INTRAMUSCULAR | Status: AC
  Administered 2015-05-22: 30 mg via INTRAMUSCULAR
  Filled 2015-05-22: qty 1

## 2015-05-22 NOTE — ED Provider Notes (Signed)
CSN: [id number]     Arrival date & time 05/22/15  2011 History   First MD Initiated Contact with Patient 05/22/15 2041     Chief Complaint  Patient presents with  . Abdominal Pain    HPI   [name] is an 25 y.o. female who was seen in the ED two days ago and found to have rupture hemorrhagic ovarian cyst on [place]. She presents to the ED today for evaluation of continued abdominal pain and vaginal odor. She states initially the pain was intolerable even with Percocet. States that yesterday the pain was so bad she had several episodes of feeling lightheaded but denies complete LOC. States today the pain is better but she was in the restroom earlier and states that she smelled a very strong vaginal odor. States it smelled like "something rotten." She denies vaginal discharge. States she finished her menstrual cycle yesterday. Denies fever, chills, n/v/d. Denies dysuria, urinary frequency/urgency. Wet prep from last visit shows +clue cells and WBC. GC/chlamydia still pending. She states she was only given percocet but no antibiotics Pt does not want pelvic exam today.   Past Medical History  Diagnosis Date  . Ovarian cyst   . Bronchitis   . Shingles   . Breast cyst   . Dysmenorrhea   . Fibroids    Past Surgical History  Procedure Laterality Date  . Breast lumpectomy    . Rectal polypectomy     Family History  Problem Relation Age of Onset  . Hypertension Father    Social History  Substance Use Topics  . Smoking status: Current Every Day Smoker -- 0.50 packs/day for 6 years    Types: Cigarettes  . Smokeless tobacco: Never Used  . Alcohol Use: Yes     Comment: occ   OB History    Gravida Para Term Preterm AB TAB SAB Ectopic Multiple Living   0              Review of Systems  All other systems reviewed and are negative.     Allergies  Review of patient's allergies indicates no known allergies.  Home Medications   Prior to Admission medications   Medication Sig Start Date  End Date Taking? Authorizing Provider  oxyCODONE-acetaminophen (PERCOCET/ROXICET) 5-325 MG tablet Take 1-2 tablets by mouth every 6 (six) hours as needed for severe pain. 05/19/15   Alfonzo Beers, MD   BP 138/95 mmHg  Pulse 87  Temp(Src) 97.8 F (36.6 C) (Oral)  Resp 20  Ht 5\' 6"  (1.676 m)  Wt 108.863 kg  BMI 38.76 kg/m2  SpO2 100%  LMP 05/18/2015 Physical Exam  Constitutional: She is oriented to person, place, and time.  HENT:  Right Ear: External ear normal.  Left Ear: External ear normal.  Nose: Nose normal.  Mouth/Throat: Oropharynx is clear and moist. No oropharyngeal exudate.  Eyes: Conjunctivae and EOM are normal. Pupils are equal, round, and reactive to light.  Neck: Normal range of motion. Neck supple.  Cardiovascular: Normal rate, regular rhythm, normal heart sounds and intact distal pulses.   Pulmonary/Chest: Effort normal and breath sounds normal. No respiratory distress. She has no wheezes. She exhibits no tenderness.  Abdominal: Soft. Bowel sounds are normal. She exhibits no distension. There is tenderness in the suprapubic area. There is no rebound, no guarding and no CVA tenderness.  Genitourinary:  PT DECLINED  Musculoskeletal: She exhibits no edema.  Neurological: She is alert and oriented to person, place, and time. No cranial nerve deficit.  Skin: Skin is warm and dry.  Psychiatric: She has a normal mood and affect.  Nursing note and vitals reviewed.   ED Course  Procedures (including critical care time) Labs Review Labs Reviewed  URINALYSIS, ROUTINE W REFLEX MICROSCOPIC (NOT AT [facility]) - Abnormal; Notable for the following:    APPearance CLOUDY (*)    Specific Gravity, Urine 1.031 (*)    Hgb urine dipstick SMALL (*)    All other components within normal limits  URINE MICROSCOPIC-ADD ON - Abnormal; Notable for the following:    Squamous Epithelial / LPF 6-30 (*)    Bacteria, UA RARE (*)    All other components within normal limits  URINE CULTURE   PREGNANCY, URINE  CBC WITH DIFFERENTIAL/PLATELET    Imaging Review No results found. I have personally reviewed and evaluated these images and lab results as part of my medical decision-making.   EKG Interpretation None      MDM   Final diagnoses:  Bacterial vaginosis  Lower abdominal pain    Pt with h/o recently rupture hemorrhagic cyst, now with vaginal odor. Wet prep from two days ago shows +clue cells and WBC. UA today with small hgb, rare bacteria, signs of contamination. Will send for culture. Pt does not want to do pelvic exam today. Will treat as BV. CBC ordered to make sure pt is not anemic as she also complained of some lightheadedness yesterday, though she denies those symptoms today and is asymptomatic in the ED with ambulation.   CBC unremarkable with no e/o anemia. Pt discharged home as above with flagyl and instructions for close f/u with OBGYN. Strict ER return precautions given. Pt verbalized her understanding.   Anne Ng, PA-C 05/22/15 2146  Gareth Morgan, MD 05/23/15 1310

## 2015-05-22 NOTE — Discharge Instructions (Signed)
You were seen in the emergency room today for evaluation of abdominal pain and vaginal odor. Per your request we did not do a pelvic exam today. Your tests from a couple days ago did show evidence of bacterial vaginosis. I will give you a prescription for antibiotics. BV might be exacerbating your abdominal pain from your recently ruptured cyst. Your other bloodwork and urine test were normal. Please follow up with OBGYN early next week. Return to the ED for new or worsening symptoms.   Bacterial Vaginosis Bacterial vaginosis is a vaginal infection that occurs when the normal balance of bacteria in the vagina is disrupted. It results from an overgrowth of certain bacteria. This is the most common vaginal infection in women of childbearing age. Treatment is important to prevent complications, especially in pregnant women, as it can cause a premature delivery. CAUSES  Bacterial vaginosis is caused by an increase in harmful bacteria that are normally present in smaller amounts in the vagina. Several different kinds of bacteria can cause bacterial vaginosis. However, the reason that the condition develops is not fully understood. RISK FACTORS Certain activities or behaviors can put you at an increased risk of developing bacterial vaginosis, including:  Having a new sex partner or multiple sex partners.  Douching.  Using an intrauterine device (IUD) for contraception. Women do not get bacterial vaginosis from toilet seats, bedding, swimming pools, or contact with objects around them. SIGNS AND SYMPTOMS  Some women with bacterial vaginosis have no signs or symptoms. Common symptoms include:  Grey vaginal discharge.  A fishlike odor with discharge, especially after sexual intercourse.  Itching or burning of the vagina and vulva.  Burning or pain with urination. DIAGNOSIS  Your health care provider will take a medical history and examine the vagina for signs of bacterial vaginosis. A sample of  vaginal fluid may be taken. Your health care provider will look at this sample under a microscope to check for bacteria and abnormal cells. A vaginal pH test may also be done.  TREATMENT  Bacterial vaginosis may be treated with antibiotic medicines. These may be given in the form of a pill or a vaginal cream. A second round of antibiotics may be prescribed if the condition comes back after treatment. Because bacterial vaginosis increases your risk for sexually transmitted diseases, getting treated can help reduce your risk for chlamydia, gonorrhea, HIV, and herpes. HOME CARE INSTRUCTIONS   Only take over-the-counter or prescription medicines as directed by your health care provider.  If antibiotic medicine was prescribed, take it as directed. Make sure you finish it even if you start to feel better.  Tell all sexual partners that you have a vaginal infection. They should see their health care provider and be treated if they have problems, such as a mild rash or itching.  During treatment, it is important that you follow these instructions:  Avoid sexual activity or use condoms correctly.  Do not douche.  Avoid alcohol as directed by your health care provider.  Avoid breastfeeding as directed by your health care provider. SEEK MEDICAL CARE IF:   Your symptoms are not improving after 3 days of treatment.  You have increased discharge or pain.  You have a fever. MAKE SURE YOU:   Understand these instructions.  Will watch your condition.  Will get help right away if you are not doing well or get worse. FOR MORE INFORMATION  Centers for Disease Control and Prevention, Division of STD Prevention: [employer] American Sexual Health Association (ASHA): www.ashastd.org  This information is not intended to replace advice given to you by your health care provider. Make sure you discuss any questions you have with your health care provider.   Document Released: 04/12/2005 Document  Revised: 05/03/2014 Document Reviewed: 11/22/2012 Elsevier Interactive Patient Education [employer].

## 2015-05-22 NOTE — ED Notes (Signed)
Abdominal pain vaginal discharge and odor. She was seen earlier in the week and told she has fibroids and a ruptured ovarian cyst.

## 2015-05-22 NOTE — ED Notes (Signed)
Pt. Reports she was seen on Monday here and was told she had inflamation due to fibroid tumors.  Pt. Reports back on tues. And left due to too many people here.  Pt. Reports pain today and a smell in her vagina and sharp pain in her upper back area.

## 2015-05-24 LAB — URINE CULTURE

## 2015-05-29 ENCOUNTER — Encounter (HOSPITAL_COMMUNITY): Payer: Self-pay | Admitting: *Deleted

## 2015-05-29 ENCOUNTER — Inpatient Hospital Stay (HOSPITAL_COMMUNITY)
Admission: AD | Admit: 2015-05-29 | Discharge: 2015-05-29 | Disposition: A | Discharge status: Home / Self Care | Payer: Self-pay | Source: Ambulatory Visit | Attending: Obstetrics and Gynecology | Admitting: Obstetrics and Gynecology

## 2015-05-29 ENCOUNTER — Inpatient Hospital Stay (HOSPITAL_COMMUNITY): Payer: Self-pay

## 2015-05-29 DIAGNOSIS — D219 Benign neoplasm of connective and other soft tissue, unspecified: Secondary | ICD-10-CM | POA: Diagnosis present

## 2015-05-29 DIAGNOSIS — Z3202 Encounter for pregnancy test, result negative: Secondary | ICD-10-CM | POA: Insufficient documentation

## 2015-05-29 DIAGNOSIS — F17210 Nicotine dependence, cigarettes, uncomplicated: Secondary | ICD-10-CM | POA: Insufficient documentation

## 2015-05-29 DIAGNOSIS — D259 Leiomyoma of uterus, unspecified: Secondary | ICD-10-CM | POA: Insufficient documentation

## 2015-05-29 DIAGNOSIS — R102 Pelvic and perineal pain: Secondary | ICD-10-CM | POA: Insufficient documentation

## 2015-05-29 HISTORY — DX: Gastric ulcer, unspecified as acute or chronic, without hemorrhage or perforation: K25.9

## 2015-05-29 LAB — URINALYSIS, ROUTINE W REFLEX MICROSCOPIC
BILIRUBIN URINE: NEGATIVE
GLUCOSE, UA: NEGATIVE mg/dL
KETONES UR: NEGATIVE mg/dL
Leukocytes, UA: NEGATIVE
Nitrite: NEGATIVE
PROTEIN: NEGATIVE mg/dL
Specific Gravity, Urine: 1.02 (ref 1.005–1.030)
pH: 7.5 (ref 5.0–8.0)

## 2015-05-29 LAB — URINE MICROSCOPIC-ADD ON: BACTERIA UA: NONE SEEN

## 2015-05-29 LAB — POCT PREGNANCY, URINE: PREG TEST UR: NEGATIVE

## 2015-05-29 LAB — CBC
HCT: 35.3 % — ABNORMAL LOW (ref 36.0–46.0)
HEMOGLOBIN: 11.7 g/dL — AB (ref 12.0–15.0)
MCH: 29 pg (ref 26.0–34.0)
MCHC: 33.1 g/dL (ref 30.0–36.0)
MCV: 87.4 fL (ref 78.0–100.0)
PLATELETS: 318 10*3/uL (ref 150–400)
RBC: 4.04 MIL/uL (ref 3.87–5.11)
RDW: 13.2 % (ref 11.5–15.5)
WBC: 9.8 10*3/uL (ref 4.0–10.5)

## 2015-05-29 MED ORDER — KETOROLAC TROMETHAMINE 60 MG/2ML IM SOLN
60.0000 mg | Freq: Once | INTRAMUSCULAR | Status: AC
  Administered 2015-05-29: 60 mg via INTRAMUSCULAR
  Filled 2015-05-29: qty 2

## 2015-05-29 MED ORDER — OXYCODONE-ACETAMINOPHEN 5-325 MG PO TABS
1.0000 | ORAL_TABLET | ORAL | Status: AC
  Administered 2015-05-29: 1 via ORAL
  Filled 2015-05-29: qty 1

## 2015-05-29 MED ORDER — KETOROLAC TROMETHAMINE 10 MG PO TABS: 10.0000 mg | ORAL_TABLET | Freq: Four times a day (QID) | ORAL | Status: DC | PRN

## 2015-05-29 NOTE — [facility] Provider Note (Signed)
History     Laura Vargas, 25 yo, G0P0, LMP 05/18/15 presents to [facility]U unannounced to lower pelvic "Fibroid"pain almost 2 weeks; seen at urgent care 3 times last week; given rx for Flagyl and percocet; diagnosed with ruptured ovarian cyst, fibroids and BV; states percocet does not work for her pain; has not taken any pain meds today.   Patient tearful, rocking and holding lower pelvic region.  Pt has not been seen at [place] office since 02/2015 for her AEX and will not have updated insurance until March 2016.   Patient Active Problem List   Diagnosis Date Noted  . Acute pelvic pain, female 05/29/2015  . Fibroids 05/29/2015  . Irregular menstrual cycle 03/20/2012  . Dysmenorrhea 03/20/2012    Chief Complaint  Patient presents with  . Abdominal Pain   HPI  OB History    Gravida Para Term Preterm AB TAB SAB Ectopic Multiple Living   0               Past Medical History  Diagnosis Date  . Ovarian cyst   . Bronchitis   . Shingles   . Breast cyst   . Dysmenorrhea   . Fibroids   . Pyloric ulcer     Past Surgical History  Procedure Laterality Date  . Breast lumpectomy    . Rectal polypectomy      Family History  Problem Relation Age of Onset  . Hypertension Father     Social History  Substance Use Topics  . Smoking status: Current Every Day Smoker -- 0.50 packs/day for 6 years    Types: Cigarettes  . Smokeless tobacco: Never Used  . Alcohol Use: Yes     Comment: occ    Allergies: No Known Allergies  Prescriptions prior to admission  Medication Sig Dispense Refill Last Dose  . metroNIDAZOLE (FLAGYL) 500 MG tablet Take 1 tablet (500 mg total) by mouth 2 (two) times daily. 14 tablet 0 05/28/2015 at Unknown time  . oxyCODONE-acetaminophen (PERCOCET/ROXICET) 5-325 MG tablet Take 1-2 tablets by mouth every 6 (six) hours as needed for severe pain. (Patient not taking: Reported on 05/29/2015) 15 tablet 0     Recent Results (from the past 2160 hour(s))  GC/Chlamydia probe amp  ([place])not at [facility]     Status: None   Collection Time: 05/19/15 12:00 AM  Result Value Ref Range   Chlamydia Negative     Comment: Normal Reference Range - Negative   Neisseria gonorrhea Negative     Comment: Normal Reference Range - Negative  Urinalysis, Routine w reflex microscopic (not at [facility])     Status: Abnormal   Collection Time: 05/19/15  3:30 PM  Result Value Ref Range   Color, Urine YELLOW YELLOW   APPearance CLEAR CLEAR   Specific Gravity, Urine 1.024 1.005 - 1.030   pH 7.0 5.0 - 8.0   Glucose, UA NEGATIVE NEGATIVE mg/dL   Hgb urine dipstick LARGE (A) NEGATIVE   Bilirubin Urine NEGATIVE NEGATIVE   Ketones, ur NEGATIVE NEGATIVE mg/dL   Protein, ur NEGATIVE NEGATIVE mg/dL   Nitrite NEGATIVE NEGATIVE   Leukocytes, UA NEGATIVE NEGATIVE  Pregnancy, urine     Status: None   Collection Time: 05/19/15  3:30 PM  Result Value Ref Range   Preg Test, Ur NEGATIVE NEGATIVE    Comment:        THE SENSITIVITY OF THIS METHODOLOGY IS >20 mIU/mL.   Urine microscopic-add on     Status: Abnormal   Collection Time:  05/19/15  3:30 PM  Result Value Ref Range   Squamous Epithelial / LPF 0-5 (A) NONE SEEN   WBC, UA 0-5 0 - 5 WBC/hpf   RBC / HPF TOO NUMEROUS TO COUNT 0 - 5 RBC/hpf   Bacteria, UA RARE (A) NONE SEEN  Wet prep, genital     Status: Abnormal   Collection Time: 05/19/15  3:50 PM  Result Value Ref Range   Yeast Wet Prep HPF POC NONE SEEN NONE SEEN   Trich, Wet Prep NONE SEEN NONE SEEN   Clue Cells Wet Prep HPF POC PRESENT (A) NONE SEEN   WBC, Wet Prep HPF POC FEW (A) NONE SEEN   Sperm NONE SEEN   Urinalysis, Routine w reflex microscopic (not at [facility])     Status: Abnormal   Collection Time: 05/22/15  8:20 PM  Result Value Ref Range   Color, Urine YELLOW YELLOW   APPearance CLOUDY (A) CLEAR   Specific Gravity, Urine 1.031 (H) 1.005 - 1.030   pH 5.5 5.0 - 8.0   Glucose, UA NEGATIVE NEGATIVE mg/dL   Hgb urine dipstick SMALL (A) NEGATIVE   Bilirubin Urine NEGATIVE  NEGATIVE   Ketones, ur NEGATIVE NEGATIVE mg/dL   Protein, ur NEGATIVE NEGATIVE mg/dL   Nitrite NEGATIVE NEGATIVE   Leukocytes, UA NEGATIVE NEGATIVE  Pregnancy, urine     Status: None   Collection Time: 05/22/15  8:20 PM  Result Value Ref Range   Preg Test, Ur NEGATIVE NEGATIVE    Comment:        THE SENSITIVITY OF THIS METHODOLOGY IS >20 mIU/mL.   Urine microscopic-add on     Status: Abnormal   Collection Time: 05/22/15  8:20 PM  Result Value Ref Range   Squamous Epithelial / LPF 6-30 (A) NONE SEEN   WBC, UA NONE SEEN 0 - 5 WBC/hpf   RBC / HPF 0-5 0 - 5 RBC/hpf   Bacteria, UA RARE (A) NONE SEEN  Urine culture     Status: None   Collection Time: 05/22/15  8:20 PM  Result Value Ref Range   Specimen Description URINE, CLEAN CATCH    Special Requests NONE    Culture      MULTIPLE SPECIES PRESENT, SUGGEST RECOLLECTION Performed at [facility]    Report Status 05/24/2015 FINAL   CBC with Differential     Status: None   Collection Time: 05/22/15  9:24 PM  Result Value Ref Range   WBC 10.3 4.0 - 10.5 K/uL   RBC 4.38 3.87 - 5.11 MIL/uL   Hemoglobin 12.5 12.0 - 15.0 g/dL   HCT 38.7 36.0 - 46.0 %   MCV 88.4 78.0 - 100.0 fL   MCH 28.5 26.0 - 34.0 pg   MCHC 32.3 30.0 - 36.0 g/dL   RDW 13.1 11.5 - 15.5 %   Platelets 350 150 - 400 K/uL   Neutrophils Relative % 60 %   Neutro Abs 6.3 1.7 - 7.7 K/uL   Lymphocytes Relative 33 %   Lymphs Abs 3.4 0.7 - 4.0 K/uL   Monocytes Relative 5 %   Monocytes Absolute 0.5 0.1 - 1.0 K/uL   Eosinophils Relative 2 %   Eosinophils Absolute 0.2 0.0 - 0.7 K/uL   Basophils Relative 0 %   Basophils Absolute 0.0 0.0 - 0.1 K/uL  Urinalysis, Routine w reflex microscopic (not at [facility])     Status: Abnormal   Collection Time: 05/29/15  2:35 PM  Result Value Ref Range   Color, Urine  YELLOW YELLOW   APPearance CLEAR CLEAR   Specific Gravity, Urine 1.020 1.005 - 1.030   pH 7.5 5.0 - 8.0   Glucose, UA NEGATIVE NEGATIVE mg/dL   Hgb urine dipstick  TRACE (A) NEGATIVE   Bilirubin Urine NEGATIVE NEGATIVE   Ketones, ur NEGATIVE NEGATIVE mg/dL   Protein, ur NEGATIVE NEGATIVE mg/dL   Nitrite NEGATIVE NEGATIVE   Leukocytes, UA NEGATIVE NEGATIVE  Urine microscopic-add on     Status: Abnormal   Collection Time: 05/29/15  2:35 PM  Result Value Ref Range   Squamous Epithelial / LPF 0-5 (A) NONE SEEN   WBC, UA 0-5 0 - 5 WBC/hpf   RBC / HPF 0-5 0 - 5 RBC/hpf   Bacteria, UA NONE SEEN NONE SEEN  CBC     Status: Abnormal   Collection Time: 05/29/15  4:27 PM  Result Value Ref Range   WBC 9.8 4.0 - 10.5 K/uL   RBC 4.04 3.87 - 5.11 MIL/uL   Hemoglobin 11.7 (L) 12.0 - 15.0 g/dL   HCT 35.3 (L) 36.0 - 46.0 %   MCV 87.4 78.0 - 100.0 fL   MCH 29.0 26.0 - 34.0 pg   MCHC 33.1 30.0 - 36.0 g/dL   RDW 13.2 11.5 - 15.5 %   Platelets 318 150 - 400 K/uL  Pregnancy, urine POC     Status: None   Collection Time: 05/29/15  4:48 PM  Result Value Ref Range   Preg Test, Ur NEGATIVE NEGATIVE    Comment:        THE SENSITIVITY OF THIS METHODOLOGY IS >24 mIU/mL        ROS Physical Exam   Blood pressure 116/39, pulse 76, temperature 98 F (36.7 C), temperature source Oral, resp. rate 18, last menstrual period 05/18/2015.  Filed Vitals:   05/29/15 1448 05/29/15 1943  BP: 122/61 116/39  Pulse: 91 76  Temp: 98 F (36.7 C)   TempSrc: Oral   Resp: 18     Physical Exam  Constitutional: She is oriented to person, place, and time. She appears well-developed and well-nourished.  HENT:  Head: Normocephalic.  Eyes: Pupils are equal, round, and reactive to light.  Neck: Normal range of motion.  Cardiovascular: Normal rate and regular rhythm.   Respiratory: Effort normal.  GI: Soft. There is tenderness in the suprapubic area and left lower quadrant. There is guarding and tenderness at McBurney's point. There is no rigidity, no rebound, no CVA tenderness and negative Murphy's sign.  Musculoskeletal: Normal range of motion.  Neurological: She is alert  and oriented to person, place, and time. She has normal reflexes.  Skin: Skin is warm and dry.  Psychiatric: She has a normal mood and affect. Her behavior is normal. Judgment and thought content normal.    ED Course  Assessment: Acute Pelvic Pain -Labs from 05/19/15 - Negative UPT, GT/CH neg/neg, BV positive -[place] from 05/19/15 -  Fundal Fibroid(6.5cm x4.2cm x5.1cm) noted, moderate free fluid with debris- potential ruptured hemorraghic ovarian cyst -Flagyl Tx for bv, day 4/7  Preliminary Plan: CBC - wnl Repeat US Pelvic Complete- Fibrroid noted, small amount free fluid in adenexas UA  - wnk UPT- negative   Final Plan: Discharge Home Toradol  IM - 60mg  Once Percocet, 1 tab now Rx: Toradol PO 10 mg every 6 hrs for 5 days  Discussed importance of NOT taking Tylenol or Motrin (ibuprofen) while taking Toradol  May take Percocet, PRN  Call PRN if symptoms worsen Advised to make an appointment  with [facility] as soon as able for continued fibroid pain   Lavetta Nielsen CNM, MSN 05/29/2015 7:53 PM

## 2015-05-29 NOTE — Discharge Instructions (Signed)
Pelvic Pain, Female Pelvic pain is pain felt below the belly button and between your hips. It can be caused by many different things. It is important to get help right away. This is especially true for severe, sharp, or unusual pain that comes on suddenly.  HOME CARE  Only take medicine as told by your doctor.  Rest as told by your doctor.  Eat a healthy diet, such as fruits, vegetables, and lean meats.  Drink enough fluids to keep your pee (urine) clear or pale yellow, or as told.  Avoid sex (intercourse) if it causes pain.  Apply warm or cold packs to your lower belly (abdomen). Use the type of pack that helps the pain.  Avoid situations that cause you stress.  Keep a journal to track your pain. Write down:  When the pain started.  Where it is located.  If there are things that seem to be related to the pain, such as food or your period.  Follow up with your doctor as told. GET HELP RIGHT AWAY IF:   You have heavy bleeding from the vagina.  You have more pelvic pain.  You feel lightheaded or pass out (faint).  You have chills.  You have pain when you pee or have blood in your pee.  You cannot stop having watery poop (diarrhea).  You cannot stop throwing up (vomiting).  You have a fever or lasting symptoms for more than 3 days.  You have a fever and your symptoms suddenly get worse.  You are being physically or sexually abused.  Your medicine does not help your pain.  You have fluid (discharge) coming from your vagina that is not normal. MAKE SURE YOU:  Understand these instructions.  Will watch your condition.  Will get help if you are not doing well or get worse.   This information is not intended to replace advice given to you by your health care provider. Make sure you discuss any questions you have with your health care provider.   Document Released: 09/29/2007 Document Revised: 05/03/2014 Document Reviewed: 08/02/2011 Elsevier Interactive Patient  Education 2016 Elsevier Inc.  Uterine Fibroids Uterine fibroids are tissue masses (tumors) that can develop in the womb (uterus). They are also called leiomyomas. This type of tumor is not cancerous (benign) and does not spread to other parts of the body outside of the pelvic area, which is between the hip bones. Occasionally, fibroids may develop in the fallopian tubes, in the cervix, or on the support structures (ligaments) that surround the uterus. You can have one or many fibroids. Fibroids can vary in size, weight, and where they grow in the uterus. Some can become quite large. Most fibroids do not require medical treatment. CAUSES A fibroid can develop when a single uterine cell keeps growing (replicating). Most cells in the human body have a control mechanism that keeps them from replicating without control. SIGNS AND SYMPTOMS Symptoms may include:   Heavy bleeding during your period.  Bleeding or spotting between periods.  Pelvic pain and pressure.  Bladder problems, such as needing to urinate more often (urinary frequency) or urgently.  Inability to reproduce offspring (infertility).  Miscarriages. DIAGNOSIS Uterine fibroids are diagnosed through a physical exam. Your health care provider may feel the lumpy tumors during a pelvic exam. Ultrasonography and an MRI may be done to determine the size, location, and number of fibroids. TREATMENT Treatment may include:  Watchful waiting. This involves getting the fibroid checked by your health care provider to see  if it grows or shrinks. Follow your health care provider's recommendations for how often to have this checked.  Hormone medicines. These can be taken by mouth or given through an intrauterine device (IUD).  Surgery.  Removing the fibroids (myomectomy) or the uterus (hysterectomy).  Removing blood supply to the fibroids (uterine artery embolization). If fibroids interfere with your fertility and you want to become  pregnant, your health care provider may recommend having the fibroids removed.  HOME CARE INSTRUCTIONS  Keep all follow-up visits as directed by your health care provider. This is important.  Take medicines only as directed by your health care provider.  If you were prescribed a hormone treatment, take the hormone medicines exactly as directed.  Do not take aspirin, because it can cause bleeding.  Ask your health care provider about taking iron pills and increasing the amount of dark green, leafy vegetables in your diet. These actions can help to boost your blood iron levels, which may be affected by heavy menstrual bleeding.  Pay close attention to your period and tell your health care provider about any changes, such as:  Increased blood flow that requires you to use more pads or tampons than usual per month.  A change in the number of days that your period lasts per month.  A change in symptoms that are associated with your period, such as abdominal cramping or back pain. SEEK MEDICAL CARE IF:  You have pelvic pain, back pain, or abdominal cramps that cannot be controlled with medicines.  You have an increase in bleeding between and during periods.  You soak tampons or pads in a half hour or less.  You feel lightheaded, extra tired, or weak. SEEK IMMEDIATE MEDICAL CARE IF:  You faint.  You have a sudden increase in pelvic pain.   This information is not intended to replace advice given to you by your health care provider. Make sure you discuss any questions you have with your health care provider.   Document Released: 04/09/2000 Document Revised: 05/03/2014 Document Reviewed: 10/09/2013 Elsevier Interactive Patient Education [employer].

## 2015-05-29 NOTE — MAU Note (Addendum)
C/o lower abdominal pain for almost 2 weeks; seen at urgent care 3 times last week; given rx for antibiotics and percocet; diagnosed with ruptured ovarian cyst and inflamed fibroid; states percocet does not work for her pain; has not taken any pain meds today;

## 2015-07-03 ENCOUNTER — Other Ambulatory Visit: Payer: Self-pay | Admitting: Obstetrics & Gynecology

## 2015-07-08 ENCOUNTER — Encounter (HOSPITAL_COMMUNITY): Payer: Self-pay

## 2015-07-08 ENCOUNTER — Encounter (HOSPITAL_COMMUNITY)
Admission: RE | Admit: 2015-07-08 | Discharge: 2015-07-08 | Disposition: A | Discharge status: Home / Self Care | Payer: BLUE CROSS/BLUE SHIELD | Source: Ambulatory Visit | Attending: Obstetrics & Gynecology | Admitting: Obstetrics & Gynecology

## 2015-07-08 DIAGNOSIS — Z01812 Encounter for preprocedural laboratory examination: Secondary | ICD-10-CM | POA: Diagnosis not present

## 2015-07-08 LAB — CBC
HEMATOCRIT: 35.6 % — AB (ref 36.0–46.0)
HEMOGLOBIN: 11.7 g/dL — AB (ref 12.0–15.0)
MCH: 28.6 pg (ref 26.0–34.0)
MCHC: 32.9 g/dL (ref 30.0–36.0)
MCV: 87 fL (ref 78.0–100.0)
Platelets: 281 10*3/uL (ref 150–400)
RBC: 4.09 MIL/uL (ref 3.87–5.11)
RDW: 13.2 % (ref 11.5–15.5)
WBC: 8.1 10*3/uL (ref 4.0–10.5)

## 2015-07-08 LAB — TYPE AND SCREEN
ABO/RH(D): B POS
Antibody Screen: NEGATIVE

## 2015-07-08 LAB — ABO/RH: ABO/RH(D): B POS

## 2015-07-08 NOTE — Patient Instructions (Addendum)
Your procedure is scheduled on:  Wednesday, July 16, 2015  Enter through the [employer] [facility] at:  11:15 AM  Pick up the phone at the desk and dial [phone].  Call this number if you have problems the morning of surgery: [phone].  Remember:  Do NOT eat food:  After Midnight Tuesday, July 15, 2015  Do NOT drink clear liquids after:  8:30 AM day of surgery  Take these medicines the morning of surgery with a SIP OF WATER:  None  Do NOT wear jewelry (body piercing), metal hair clips/bobby pins, make-up, or nail polish. Do NOT wear lotions, powders, or perfumes.  You may wear deoderant. Do NOT shave for 48 hours prior to surgery. Do NOT bring valuables to the hospital. Contacts, dentures, or bridgework may not be worn into surgery.  Leave suitcase in car.  After surgery it may be brought to your room.  For patients admitted to the hospital, checkout time is 11:00 AM the day of discharge.  Have a responsible adult drive you home and remain with you for the first 24 hours after the procedure.

## 2015-07-16 ENCOUNTER — Encounter (HOSPITAL_COMMUNITY)
Admission: RE | Disposition: A | Discharge status: Home / Self Care | Payer: Self-pay | Source: Ambulatory Visit | Attending: Obstetrics & Gynecology

## 2015-07-16 ENCOUNTER — Inpatient Hospital Stay (HOSPITAL_COMMUNITY)
Admission: RE | Admit: 2015-07-16 | Discharge: 2015-07-18 | DRG: 742 | Disposition: A | Discharge status: Home / Self Care | Payer: BLUE CROSS/BLUE SHIELD | Source: Ambulatory Visit | Attending: Obstetrics & Gynecology | Admitting: Obstetrics & Gynecology

## 2015-07-16 ENCOUNTER — Ambulatory Visit (HOSPITAL_COMMUNITY): Payer: BLUE CROSS/BLUE SHIELD | Admitting: Anesthesiology

## 2015-07-16 ENCOUNTER — Encounter (HOSPITAL_COMMUNITY): Payer: Self-pay | Admitting: Emergency Medicine

## 2015-07-16 DIAGNOSIS — D251 Intramural leiomyoma of uterus: Secondary | ICD-10-CM | POA: Diagnosis present

## 2015-07-16 DIAGNOSIS — N946 Dysmenorrhea, unspecified: Secondary | ICD-10-CM | POA: Diagnosis present

## 2015-07-16 DIAGNOSIS — N803 Endometriosis of pelvic peritoneum: Secondary | ICD-10-CM | POA: Diagnosis present

## 2015-07-16 DIAGNOSIS — Z9889 Other specified postprocedural states: Secondary | ICD-10-CM

## 2015-07-16 DIAGNOSIS — N979 Female infertility, unspecified: Secondary | ICD-10-CM | POA: Diagnosis present

## 2015-07-16 DIAGNOSIS — D649 Anemia, unspecified: Secondary | ICD-10-CM | POA: Diagnosis present

## 2015-07-16 DIAGNOSIS — Z6841 Body Mass Index (BMI) 40.0 and over, adult: Secondary | ICD-10-CM | POA: Diagnosis not present

## 2015-07-16 DIAGNOSIS — F17210 Nicotine dependence, cigarettes, uncomplicated: Secondary | ICD-10-CM | POA: Diagnosis present

## 2015-07-16 DIAGNOSIS — G8929 Other chronic pain: Secondary | ICD-10-CM | POA: Diagnosis present

## 2015-07-16 DIAGNOSIS — Z8711 Personal history of peptic ulcer disease: Secondary | ICD-10-CM

## 2015-07-16 DIAGNOSIS — R102 Pelvic and perineal pain: Secondary | ICD-10-CM | POA: Diagnosis present

## 2015-07-16 DIAGNOSIS — D252 Subserosal leiomyoma of uterus: Secondary | ICD-10-CM | POA: Diagnosis present

## 2015-07-16 HISTORY — PX: LAPAROTOMY: SHX154

## 2015-07-16 HISTORY — PX: LAPAROSCOPY: SHX197

## 2015-07-16 LAB — PREGNANCY, URINE: Preg Test, Ur: NEGATIVE

## 2015-07-16 SURGERY — LAPAROSCOPY OPERATIVE
Anesthesia: General | Site: Abdomen

## 2015-07-16 MED ORDER — GLYCOPYRROLATE 0.2 MG/ML IJ SOLN
INTRAMUSCULAR | Status: DC | PRN
  Administered 2015-07-16: 0.2 mg via INTRAVENOUS
  Administered 2015-07-16: .7 mg via INTRAVENOUS

## 2015-07-16 MED ORDER — MIDAZOLAM HCL 5 MG/5ML IJ SOLN
INTRAMUSCULAR | Status: DC | PRN
  Administered 2015-07-16 (×2): 2 mg via INTRAVENOUS

## 2015-07-16 MED ORDER — DIPHENHYDRAMINE HCL 12.5 MG/5ML PO ELIX: 12.5000 mg | ORAL_SOLUTION | Freq: Four times a day (QID) | ORAL | Status: DC | PRN

## 2015-07-16 MED ORDER — METHYLENE BLUE 1 % INJ SOLN
INTRAMUSCULAR | Status: AC
  Filled 2015-07-16: qty 1

## 2015-07-16 MED ORDER — FENTANYL CITRATE (PF) 100 MCG/2ML IJ SOLN
INTRAMUSCULAR | Status: DC | PRN
  Administered 2015-07-16 (×3): 50 ug via INTRAVENOUS
  Administered 2015-07-16 (×2): 100 ug via INTRAVENOUS
  Administered 2015-07-16: 50 ug via INTRAVENOUS
  Administered 2015-07-16: 100 ug via INTRAVENOUS

## 2015-07-16 MED ORDER — SCOPOLAMINE 1 MG/3DAYS TD PT72
MEDICATED_PATCH | TRANSDERMAL | Status: AC
  Administered 2015-07-16: 1.5 mg via TRANSDERMAL
  Filled 2015-07-16: qty 1

## 2015-07-16 MED ORDER — HYDROMORPHONE HCL 1 MG/ML IJ SOLN
INTRAMUSCULAR | Status: AC
  Filled 2015-07-16: qty 1

## 2015-07-16 MED ORDER — PROPOFOL 500 MG/50ML IV EMUL
INTRAVENOUS | Status: DC | PRN
  Administered 2015-07-16: 20 mL via INTRAVENOUS

## 2015-07-16 MED ORDER — ROCURONIUM BROMIDE 100 MG/10ML IV SOLN
INTRAVENOUS | Status: DC | PRN
  Administered 2015-07-16: 20 mg via INTRAVENOUS
  Administered 2015-07-16: 15 mg via INTRAVENOUS
  Administered 2015-07-16: 10 mg via INTRAVENOUS
  Administered 2015-07-16: 35 mg via INTRAVENOUS
  Administered 2015-07-16: 10 mg via INTRAVENOUS

## 2015-07-16 MED ORDER — ROCURONIUM BROMIDE 100 MG/10ML IV SOLN
INTRAVENOUS | Status: AC
  Filled 2015-07-16: qty 1

## 2015-07-16 MED ORDER — ONDANSETRON HCL 4 MG/2ML IJ SOLN: 4.0000 mg | Freq: Four times a day (QID) | INTRAMUSCULAR | Status: DC | PRN

## 2015-07-16 MED ORDER — IBUPROFEN 800 MG PO TABS: 800.0000 mg | ORAL_TABLET | Freq: Three times a day (TID) | ORAL | Status: DC | PRN

## 2015-07-16 MED ORDER — ONDANSETRON HCL 4 MG/2ML IJ SOLN: 4.0000 mg | Freq: Once | INTRAMUSCULAR | Status: DC | PRN

## 2015-07-16 MED ORDER — MIDAZOLAM HCL 2 MG/2ML IJ SOLN
INTRAMUSCULAR | Status: AC
  Filled 2015-07-16: qty 2

## 2015-07-16 MED ORDER — LACTATED RINGERS IV SOLN
INTRAVENOUS | Status: DC
  Administered 2015-07-16 – 2015-07-17 (×3): via INTRAVENOUS

## 2015-07-16 MED ORDER — LIDOCAINE HCL 1 % IJ SOLN
INTRAMUSCULAR | Status: AC
  Filled 2015-07-16: qty 20

## 2015-07-16 MED ORDER — FENTANYL CITRATE (PF) 250 MCG/5ML IJ SOLN
INTRAMUSCULAR | Status: AC
  Filled 2015-07-16: qty 5

## 2015-07-16 MED ORDER — VASOPRESSIN 20 UNIT/ML IV SOLN
INTRAVENOUS | Status: AC
  Filled 2015-07-16: qty 1

## 2015-07-16 MED ORDER — SODIUM CHLORIDE 0.9 % IJ SOLN
INTRAMUSCULAR | Status: AC
  Filled 2015-07-16: qty 10

## 2015-07-16 MED ORDER — HYDROMORPHONE HCL 1 MG/ML IJ SOLN
INTRAMUSCULAR | Status: DC | PRN
  Administered 2015-07-16 (×2): 1 mg via INTRAVENOUS

## 2015-07-16 MED ORDER — GLYCOPYRROLATE 0.2 MG/ML IJ SOLN
INTRAMUSCULAR | Status: AC
  Filled 2015-07-16: qty 3

## 2015-07-16 MED ORDER — ONDANSETRON HCL 4 MG/2ML IJ SOLN
INTRAMUSCULAR | Status: AC
  Filled 2015-07-16: qty 2

## 2015-07-16 MED ORDER — BUPIVACAINE-EPINEPHRINE 0.25% -1:200000 IJ SOLN
INTRAMUSCULAR | Status: DC | PRN
  Administered 2015-07-16: 6 mL

## 2015-07-16 MED ORDER — FENTANYL CITRATE (PF) 100 MCG/2ML IJ SOLN
INTRAMUSCULAR | Status: AC
  Administered 2015-07-16: 50 ug via INTRAVENOUS
  Filled 2015-07-16: qty 2

## 2015-07-16 MED ORDER — OXYCODONE-ACETAMINOPHEN 5-325 MG PO TABS: 1.0000 | ORAL_TABLET | ORAL | Status: DC | PRN

## 2015-07-16 MED ORDER — SCOPOLAMINE 1 MG/3DAYS TD PT72
1.0000 | MEDICATED_PATCH | Freq: Once | TRANSDERMAL | Status: DC
  Administered 2015-07-16: 1.5 mg via TRANSDERMAL

## 2015-07-16 MED ORDER — DIPHENHYDRAMINE HCL 50 MG/ML IJ SOLN
12.5000 mg | Freq: Four times a day (QID) | INTRAMUSCULAR | Status: DC | PRN
  Administered 2015-07-17: 12.5 mg via INTRAVENOUS
  Filled 2015-07-16: qty 1

## 2015-07-16 MED ORDER — KETOROLAC TROMETHAMINE 30 MG/ML IJ SOLN
INTRAMUSCULAR | Status: AC
  Administered 2015-07-16: 30 mg via INTRAVENOUS
  Filled 2015-07-16: qty 1

## 2015-07-16 MED ORDER — STERILE WATER FOR IRRIGATION IR SOLN
Status: DC | PRN
  Administered 2015-07-16: 30 mL

## 2015-07-16 MED ORDER — SODIUM CHLORIDE 0.9% FLUSH: 9.0000 mL | INTRAVENOUS | Status: DC | PRN

## 2015-07-16 MED ORDER — GLYCOPYRROLATE 0.2 MG/ML IJ SOLN
INTRAMUSCULAR | Status: AC
Start: 2015-07-16 — End: 2015-07-16
  Filled 2015-07-16: qty 4

## 2015-07-16 MED ORDER — SODIUM CHLORIDE 0.9 % IJ SOLN
INTRAMUSCULAR | Status: AC
  Filled 2015-07-16: qty 100

## 2015-07-16 MED ORDER — LIDOCAINE HCL (CARDIAC) 20 MG/ML IV SOLN
INTRAVENOUS | Status: DC | PRN
  Administered 2015-07-16: 50 mg via INTRAVENOUS
  Administered 2015-07-16: 30 mg via INTRAVENOUS

## 2015-07-16 MED ORDER — NALOXONE HCL 0.4 MG/ML IJ SOLN: 0.4000 mg | INTRAMUSCULAR | Status: DC | PRN

## 2015-07-16 MED ORDER — DEXAMETHASONE SODIUM PHOSPHATE 4 MG/ML IJ SOLN
INTRAMUSCULAR | Status: DC | PRN
  Administered 2015-07-16: 4 mg via INTRAVENOUS

## 2015-07-16 MED ORDER — NEOSTIGMINE METHYLSULFATE 10 MG/10ML IV SOLN
INTRAVENOUS | Status: DC | PRN
  Administered 2015-07-16: 4 mg via INTRAVENOUS

## 2015-07-16 MED ORDER — BUPIVACAINE-EPINEPHRINE (PF) 0.25% -1:200000 IJ SOLN
INTRAMUSCULAR | Status: AC
  Filled 2015-07-16: qty 30

## 2015-07-16 MED ORDER — KETOROLAC TROMETHAMINE 30 MG/ML IJ SOLN
30.0000 mg | Freq: Four times a day (QID) | INTRAMUSCULAR | Status: DC
  Administered 2015-07-17 (×2): 30 mg via INTRAVENOUS
  Filled 2015-07-16 (×2): qty 1

## 2015-07-16 MED ORDER — LIDOCAINE HCL (CARDIAC) 20 MG/ML IV SOLN
INTRAVENOUS | Status: AC
  Filled 2015-07-16: qty 5

## 2015-07-16 MED ORDER — DEXAMETHASONE SODIUM PHOSPHATE 4 MG/ML IJ SOLN
INTRAMUSCULAR | Status: AC
  Filled 2015-07-16: qty 1

## 2015-07-16 MED ORDER — BUPIVACAINE HCL (PF) 0.25 % IJ SOLN
INTRAMUSCULAR | Status: AC
  Filled 2015-07-16: qty 30

## 2015-07-16 MED ORDER — VASOPRESSIN 20 UNIT/ML IV SOLN
INTRAVENOUS | Status: DC | PRN
  Administered 2015-07-16: 6 mL via INTRAMUSCULAR

## 2015-07-16 MED ORDER — FENTANYL CITRATE (PF) 100 MCG/2ML IJ SOLN
25.0000 ug | INTRAMUSCULAR | Status: DC | PRN
  Administered 2015-07-16 (×2): 50 ug via INTRAVENOUS

## 2015-07-16 MED ORDER — KETOROLAC TROMETHAMINE 60 MG/2ML IM SOLN
INTRAMUSCULAR | Status: DC | PRN
  Administered 2015-07-16: 30 mg via INTRAMUSCULAR

## 2015-07-16 MED ORDER — LACTATED RINGERS IV SOLN
INTRAVENOUS | Status: DC
  Administered 2015-07-16 (×3): via INTRAVENOUS

## 2015-07-16 MED ORDER — ONDANSETRON HCL 4 MG/2ML IJ SOLN
INTRAMUSCULAR | Status: DC | PRN
  Administered 2015-07-16: 4 mg via INTRAVENOUS

## 2015-07-16 MED ORDER — PROPOFOL 10 MG/ML IV BOLUS
INTRAVENOUS | Status: AC
  Filled 2015-07-16: qty 20

## 2015-07-16 MED ORDER — KETOROLAC TROMETHAMINE 30 MG/ML IJ SOLN
30.0000 mg | Freq: Three times a day (TID) | INTRAMUSCULAR | Status: DC | PRN
  Administered 2015-07-16: 30 mg via INTRAVENOUS

## 2015-07-16 MED ORDER — MORPHINE SULFATE 2 MG/ML IV SOLN
INTRAVENOUS | Status: DC
  Administered 2015-07-16: 20:00:00 via INTRAVENOUS
  Administered 2015-07-16: 13.5 mg via INTRAVENOUS
  Administered 2015-07-17: 06:00:00 via INTRAVENOUS
  Administered 2015-07-17: 10.79 mg via INTRAVENOUS
  Administered 2015-07-17: 20.3 mg via INTRAVENOUS
  Filled 2015-07-16 (×2): qty 25

## 2015-07-16 MED ORDER — FENTANYL CITRATE (PF) 250 MCG/5ML IJ SOLN
INTRAMUSCULAR | Status: AC
Start: 2015-07-16 — End: 2015-07-16
  Filled 2015-07-16: qty 5

## 2015-07-16 MED ORDER — CEFAZOLIN SODIUM 10 G IJ SOLR
3.0000 g | INTRAMUSCULAR | Status: DC | PRN
  Administered 2015-07-16: 3 g via INTRAVENOUS

## 2015-07-16 SURGICAL SUPPLY — 46 items
BARRIER ADHS 3X4 INTERCEED (GAUZE/BANDAGES/DRESSINGS) ×3 IMPLANT
CABLE HIGH FREQUENCY MONO STRZ (ELECTRODE) ×3 IMPLANT
CANISTER SUCTION 2500CC (MISCELLANEOUS) ×3 IMPLANT
CATH ROBINSON RED A/P 16FR (CATHETERS) IMPLANT
CHLORAPREP W/TINT 26ML (MISCELLANEOUS) ×3 IMPLANT
CLOTH BEACON ORANGE TIMEOUT ST (SAFETY) ×3 IMPLANT
DECANTER SPIKE VIAL GLASS SM (MISCELLANEOUS) ×12 IMPLANT
DRAPE WARM FLUID 44X44 (DRAPE) ×3 IMPLANT
DRSG COVADERM PLUS 2X2 (GAUZE/BANDAGES/DRESSINGS) IMPLANT
DRSG OPSITE POSTOP 3X4 (GAUZE/BANDAGES/DRESSINGS) ×3 IMPLANT
DRSG OPSITE POSTOP 4X10 (GAUZE/BANDAGES/DRESSINGS) ×3 IMPLANT
GLOVE BIOGEL PI IND STRL 7.0 (GLOVE) ×2 IMPLANT
GLOVE BIOGEL PI INDICATOR 7.0 (GLOVE) ×4
GLOVE ECLIPSE 6.5 STRL STRAW (GLOVE) ×3 IMPLANT
GOWN STRL REUS W/TWL LRG LVL3 (GOWN DISPOSABLE) ×9 IMPLANT
LIGASURE 5MM LAPAROSCOPIC (INSTRUMENTS) IMPLANT
LIQUID BAND (GAUZE/BANDAGES/DRESSINGS) ×3 IMPLANT
NEEDLE INSUFFLATION 120MM (ENDOMECHANICALS) ×3 IMPLANT
NEEDLE SPNL 22GX7 QUINCKE BK (NEEDLE) ×3 IMPLANT
NS IRRIG 1000ML POUR BTL (IV SOLUTION) ×9 IMPLANT
PACK LAPAROSCOPY BASIN (CUSTOM PROCEDURE TRAY) ×3 IMPLANT
PAD TRENDELENBURG POSITION (MISCELLANEOUS) ×3 IMPLANT
PENCIL BUTTON HOLSTER BLD 10FT (ELECTRODE) ×3 IMPLANT
PENCIL SMOKE EVAC W/HOLSTER (ELECTROSURGICAL) ×3 IMPLANT
POUCH SPECIMEN RETRIEVAL 10MM (ENDOMECHANICALS) IMPLANT
SET IRRIG TUBING LAPAROSCOPIC (IRRIGATION / IRRIGATOR) ×3 IMPLANT
SLEEVE XCEL OPT CAN 5 100 (ENDOMECHANICALS) ×3 IMPLANT
SOLUTION ELECTROLUBE (MISCELLANEOUS) IMPLANT
SPONGE LAP 18X18 X RAY DECT (DISPOSABLE) ×6 IMPLANT
SUT MNCRL AB 4-0 PS2 18 (SUTURE) ×3 IMPLANT
SUT VIC AB 0 CT1 27 (SUTURE) ×4
SUT VIC AB 0 CT1 27XBRD ANBCTR (SUTURE) ×2 IMPLANT
SUT VIC AB 2-0 SH 27 (SUTURE) ×10
SUT VIC AB 2-0 SH 27XBRD (SUTURE) ×5 IMPLANT
SUT VICRYL 0 UR6 27IN ABS (SUTURE) ×6 IMPLANT
SUT VICRYL 4-0 PS2 18IN ABS (SUTURE) IMPLANT
TOWEL OR 17X24 6PK STRL BLUE (TOWEL DISPOSABLE) ×6 IMPLANT
TRAY FOLEY CATH SILVER 14FR (SET/KITS/TRAYS/PACK) ×3 IMPLANT
TROCAR HASSON GELL 12X100 (TROCAR) ×3 IMPLANT
TROCAR XCEL NON-BLD 11X100MML (ENDOMECHANICALS) ×3 IMPLANT
TROCAR XCEL NON-BLD 5MMX100MML (ENDOMECHANICALS) ×3 IMPLANT
TUBING NON-CON 1/4 X 20 CONN (TUBING) ×2 IMPLANT
TUBING NON-CON 1/4 X 20' CONN (TUBING) ×1
WARMER LAPAROSCOPE (MISCELLANEOUS) ×3 IMPLANT
WATER STERILE IRR 1000ML POUR (IV SOLUTION) IMPLANT
YANKAUER SUCT BULB TIP NO VENT (SUCTIONS) ×3 IMPLANT

## 2015-07-16 NOTE — Anesthesia Procedure Notes (Signed)
Procedure Name: Intubation Date/Time: 07/16/2015 1:14 PM Performed by: Vernice Jefferson Pre-anesthesia Checklist: Patient identified, Emergency Drugs available, Suction available, Patient being monitored and Timeout performed Patient Re-evaluated:Patient Re-evaluated prior to inductionOxygen Delivery Method: Circle system utilized Preoxygenation: Pre-oxygenation with 100% oxygen Intubation Type: IV induction Ventilation: Mask ventilation without difficulty Laryngoscope Size: Mac and 4 Grade View: Grade III Tube type: Oral Tube size: 7.0 mm Number of attempts: 1 Airway Equipment and Method: Stylet and Patient positioned with wedge pillow Placement Confirmation: ETT inserted through vocal cords under direct vision and positive ETCO2 Secured at: 22 cm Dental Injury: Teeth and Oropharynx as per pre-operative assessment

## 2015-07-16 NOTE — Brief Op Note (Signed)
07/16/2015  6:27 PM  PATIENT:  Laura Vargas  25 y.o. female  PRE-OPERATIVE DIAGNOSIS:  Uterine Fibroids, Chronic Pelvic Pain, Infertility.    POST-OPERATIVE DIAGNOSIS:  Uterine Fibroids, Chronic Pelvic Pain, Infertility.   PROCEDURE:  Procedure(s): LAPAROSCOPY OPERATIVE, Chromopertubation (N/A) LAPAROTOMY, Myomectomy (N/A)  SURGEON:  Surgeon(s) and Role:    * Waymon Amato, MD - Primary  PHYSICIAN ASSISTANT: Earnstine Regal, PA.   ANESTHESIA:   general  EBL:  Total I/O In: 2000 [I.V.:2000] Out: 500 [Urine:450; Blood:50]  BLOOD ADMINISTERED:none  DRAINS: none   LOCAL MEDICATIONS USED:  NONE  SPECIMEN:  No Specimen  DISPOSITION OF SPECIMEN:  PATHOLOGY  COUNTS:  YES  TOURNIQUET:  * No tourniquets in log *  DICTATION: .Dragon Dictation  PLAN OF CARE: Admit to inpatient   PATIENT DISPOSITION:  PACU - hemodynamically stable.   Delay start of Pharmacological VTE agent (>24hrs) due to surgical blood loss or risk of bleeding: not applicable

## 2015-07-16 NOTE — Anesthesia Postprocedure Evaluation (Signed)
Anesthesia Post Note  Patient: Laura Vargas  Procedure(s) Performed: Procedure(s) (LRB): LAPAROSCOPY OPERATIVE, Chromopertubation (N/A) LAPAROTOMY, Myomectomy (N/A)  Patient location during evaluation: PACU Anesthesia Type: General Level of consciousness: sedated Pain management: pain level controlled Vital Signs Assessment: post-procedure vital signs reviewed and stable Respiratory status: spontaneous breathing Cardiovascular status: stable Postop Assessment: no signs of nausea or vomiting Anesthetic complications: no    Last Vitals:  Filed Vitals:   07/16/15 1715 07/16/15 1730  BP: 132/85 134/89  Pulse: 100 79  Temp: 36.5 C   Resp: 27 9    Last Pain:  Filed Vitals:   07/16/15 1743  PainSc: [place]

## 2015-07-16 NOTE — Op Note (Signed)
Laura Vargas, Laura D.  DOB Jul 11, 1990  [id number]  Pre-op diagnosis:   1. Fibroid uterus.  2. Chronic pelvic pain.  3. Infertility.      Post-op diagnosis: 1.  Fibroid uterus.  2. Chronic pelvic pain.  3. Infertility.  4.  Endometriosis.    Procedures: 1.  Diagnostic laparoscopy.  2.  Chromopertubation.  3.  Abdominal myomectomy  Surgeon: Dr. Waymon Amato  Assistant: Earnstine Regal, PA.   Anesthesia:  General   Complications: None  Findings: Uterus with several uterine fibroids, 3 total.  Normal left and right ovary and tubes.  Normal spillage of methylene blue from both fallopian tubes.  Small endometriosis lesion in posterior cul de sac.    EBL: 50 cc  IVF: 2L  Urine output: 500 cc  Indications: 25 y/o P0 who with chronic pelvic pain, fibroid uterus and infertility.  She desired removal of fibroids and also checking of fallopian tubes for patency.      PROCEDURE:  Informed consent was obtained from the patient to undergo the procedure after explaining the risks, benefits and alternatives of the procedure including risks of bleeding, infection, damage to organs.  The patient was prepped vaginally and abdominally and draped in the usual sterile fashion  A foley catheter was placed in and Acorn manipulator placed into the cervix.         Infraumbilical region was instilled with marcaine with epinephrine and open entry into the pelvis performed using Hasson trocar.  Correct entry was confirmed by viewing the pelvic organs.  Exploration revealed the above findings.  The endometriosis implant was small, about 48mm, grey-black in color in the right posterior cul de sac.  No other endometriosis lesions were visualized.  Methylene blue die was given through the Acorn manipulator and normal spillage of dye was noted from both tubes.  The uterus was visualized and noted to be irregularly shaped anteriorlly, fundally and posteriorly.  I was concerned that that there was more than the one fibroid that  had been seen on the ultrasound and they seemed extending intramuraly.  I decided to proceed with laparotomy for this reason.  The hasson was removed and the infraumbilical fascia closed with 0-vicry and skin with 4-0 monocryl.  Attention was then turned to the lower abdomen.     Ancef IV was given at this time.  A pfannenstiel incision was made with the scalpel and carried through the underlying subcutaneous layer and fascia with the bovie.  Small perforators were contained with the bovie.  The fascia was nicked in the midline and the fascia separated from the rectus muscles superiorly, inferiorly and bilaterally.  The rectus muscle was separated in the midline and the uterus was inspected and the fibroids noted.  The O'connor O sullivan retractor was placed in and the uterus brought to the incision by placing two stay sutures at the fundus to hold it up so as to elevate the uterus.  Vasopressin was injected over the posterior fibroid which was subserosal but also extending intramurally, incised with bovie and the fibroid was shelled from the sorrounding myometrium.  The uterine defects were closed with 2-0 vicryl in a running stitch in 2 layers. The subserosal layer was closed in a baseball stitch.  A similar procedure was perfomed to remove the remaining fibroids.  Three total incisions were made through the uterus, one anteriorly to remove one subserosal/intramural fibroid and two main incisions were made posteriorly to remove one subserosal and one subserosal and extending intramural fibroid.  There was no entry into the endometrium during the procedure.  At the end of the procedure good  hemostasis was noted.    The endometriosis implant was resected using metzenbaum scissors with good hemostasis noted.  Her pelvic was note to be deep and the endometriosis implant could not be collected for specimen as it got lost as it was being pulled out of the deep pelvis.  The pelvis was copiously irrigated and  suctioned out.  Interceed was placed over the posterior uterus incisions and the uterus was returned into the cavity.      The fascia was closed off using 0-vicryl in a running stitch.  The muscle was reapproximated with 2-vicryl in interrupted sutures.  The subcutaneous layer was closed off with plain suture.  The skin was closed off with 4-0 monocryl in a subcuticular stitch.    Liquiband then dressing was placed over the infraumbilical and pfannenstiel incisions.  The Acorn manipulator and tenaculum were removed from the cervix.     The patient was awoken from anesthesia and taken to the recovery room in stable condition.   Specimen: Fibroids.    Dr. Alesia Richards.

## 2015-07-16 NOTE — H&P (Addendum)
Laura Vargas is an 25 y.o. female P 0 with chronic pelvic pain, infertility, fibroid uterus, here for laparoscopic myomectomy, possible laparotomy and chromopertubation.    Pertinent Gynecological History: Menses: flow is moderate Bleeding: None currently Contraception: none DES exposure: unknown Blood transfusions: none Sexually transmitted diseases: no past history Previous GYN Procedures: None  Last pap: Normal OB History: G0, P0  Menstrual History: Patient's last menstrual period was 06/22/2015.    Past Medical History  Diagnosis Date  . Ovarian cyst   . Bronchitis   . Shingles   . Breast cyst   . Dysmenorrhea   . Fibroids   . Pyloric ulcer     Past Surgical History  Procedure Laterality Date  . Breast lumpectomy    . Rectal polypectomy      Family History  Problem Relation Age of Onset  . Hypertension Father     Social History:  reports that she has been smoking Cigarettes.  She has a 1.5 pack-year smoking history. She has never used smokeless tobacco. She reports that she drinks alcohol. She reports that she uses illicit drugs (Marijuana).  Allergies: No Known Allergies  Prescriptions prior to admission  Medication Sig Dispense Refill Last Dose  . ketorolac (TORADOL) 10 MG tablet Take 1 tablet (10 mg total) by mouth every 6 (six) hours as needed. (Patient not taking: Reported on 07/02/2015) 20 tablet 0 Not Taking at Unknown time  . metroNIDAZOLE (FLAGYL) 500 MG tablet Take 1 tablet (500 mg total) by mouth 2 (two) times daily. (Patient not taking: Reported on 07/02/2015) 14 tablet 0 Completed Course at Unknown time  . naproxen sodium (ANAPROX) 220 MG tablet Take 220 mg by mouth daily as needed (pain).   More than a month at Unknown time  . oxyCODONE-acetaminophen (PERCOCET/ROXICET) 5-325 MG tablet Take 1-2 tablets by mouth every 6 (six) hours as needed for severe pain. (Patient not taking: Reported on 07/02/2015) 15 tablet 0 Not Taking at Unknown time     ROS  Blood pressure 128/77, pulse 81, temperature 98.2 F (36.8 C), temperature source Oral, resp. rate 18, last menstrual period 06/22/2015, SpO2 100 %. Physical Exam  Results for orders placed or performed during the hospital encounter of 07/16/15 (from the past 24 hour(s))  Pregnancy, urine     Status: None   Collection Time: 07/16/15 11:00 AM  Result Value Ref Range   Preg Test, Ur NEGATIVE NEGATIVE   Physical Exam  Constitutional: She is oriented to person, place, and time. She appears well-developed and well-nourished.  HENT:  Head: Normocephalic and atraumatic.  Neck: Normal range of motion.  Cardiovascular: Normal rate, regular rhythm and normal heart sounds.   Respiratory: Effort normal and breath sounds normal.  GI: Soft. Bowel sounds are normal.  Neurological: She is alert and oriented to person, place, and time.  Skin: Skin is warm and dry.  Psychiatric: She has a normal mood and affect. Her behavior is normal.   ROS  Constitutional: Denies fevers/chills Cardiovascular: Denies chest pain or palpitations Pulmonary: Denies coughing or wheezing Gastrointestinal: Denies nausea, vomiting or diarrhea Genitourinary: Denies pelvic pain, unusual vaginal bleeding, unusual vaginal discharge, dysuria, urgency or frequency.  Musculoskeletal: Denies muscle or joint aches and pain.  Neurology: Denies abnormal sensations such as tingling or numbness.    CBC    Component Value Date/Time   WBC 8.1 07/08/2015 1158   RBC 4.09 07/08/2015 1158   HGB 11.7* 07/08/2015 1158   HCT 35.6* 07/08/2015 1158   PLT 281  07/08/2015 1158   MCV 87.0 07/08/2015 1158   MCH 28.6 07/08/2015 1158   MCHC 32.9 07/08/2015 1158   RDW 13.2 07/08/2015 1158   LYMPHSABS 3.4 05/22/2015 2124   MONOABS 0.5 05/22/2015 2124   EOSABS 0.2 05/22/2015 2124   BASOSABS 0.0 05/22/2015 2124   Pelvic ultrasound 05/29/15: 2.5 cm left fundal fibroid unchanged.  Normal adnexa bilaterally.   Assessment/Plan: 25  y/o here for laparoscopic myomectomy, possible laparotomy, also for chromopertubation  -Admit to Day surgery -NPO -IVF -Discused with patient risks, benefits and alternatives of the procedure including risks of bleeding, infection, damage to organs.  All questions answered.   [facility] 07/16/2015, 12:43 PM

## 2015-07-16 NOTE — Anesthesia Preprocedure Evaluation (Signed)
Anesthesia Evaluation  Patient identified by MRN, date of birth, ID band Patient awake    Reviewed: Allergy & Precautions, NPO status , Patient's Chart, lab work & pertinent test results  History of Anesthesia Complications Negative for: history of anesthetic complications  Airway Mallampati: III  TM Distance: >3 FB Neck ROM: Full    Dental no notable dental hx. (+) Dental Advisory Given   Pulmonary Current Smoker,    Pulmonary exam normal breath sounds clear to auscultation       Cardiovascular negative cardio ROS Normal cardiovascular exam Rhythm:Regular Rate:Normal     Neuro/Psych negative neurological ROS  negative psych ROS   GI/Hepatic negative GI ROS, Neg liver ROS,   Endo/Other  Morbid obesity  Renal/GU negative Renal ROS  negative genitourinary   Musculoskeletal negative musculoskeletal ROS (+)   Abdominal   Peds negative pediatric ROS (+)  Hematology negative hematology ROS (+)   Anesthesia Other Findings   Reproductive/Obstetrics negative OB ROS                             Anesthesia Physical Anesthesia Plan  ASA: III  Anesthesia Plan: General   Post-op Pain Management:    Induction: Intravenous  Airway Management Planned: Oral ETT  Additional Equipment:   Intra-op Plan:   Post-operative Plan: Extubation in OR  Informed Consent: I have reviewed the patients History and Physical, chart, labs and discussed the procedure including the risks, benefits and alternatives for the proposed anesthesia with the patient or authorized representative who has indicated his/her understanding and acceptance.   Dental advisory given  Plan Discussed with: CRNA  Anesthesia Plan Comments:         Anesthesia Quick Evaluation

## 2015-07-16 NOTE — Transfer of Care (Signed)
Immediate Anesthesia Transfer of Care Note  Patient: Laura Vargas  Procedure(s) Performed: Procedure(s): LAPAROSCOPY OPERATIVE, Chromopertubation (N/A) LAPAROTOMY, Myomectomy (N/A)  Patient Location: PACU  Anesthesia Type:General  Level of Consciousness: awake, alert  and oriented  Airway & Oxygen Therapy: Patient Spontanous Breathing and Patient connected to face mask oxygen  Post-op Assessment: Report given to RN and Post -op Vital signs reviewed and stable  Post vital signs: Reviewed and stable  Last Vitals:  Filed Vitals:   07/16/15 1142  BP: 128/77  Pulse: 81  Temp: 36.8 C  Resp: 18    Complications: No apparent anesthesia complications

## 2015-07-16 NOTE — Progress Notes (Signed)
Pt has been tearful at times, asking for her mother.  Provided phone for her to call and speak with mother, update given to mother.  Pain meds and emotional support given.  Resting quietly on side after pain med given.

## 2015-07-16 NOTE — Progress Notes (Signed)
Assumed care from [place].

## 2015-07-17 ENCOUNTER — Encounter (HOSPITAL_COMMUNITY): Payer: Self-pay | Admitting: Obstetrics & Gynecology

## 2015-07-17 LAB — BASIC METABOLIC PANEL
Anion gap: 8 (ref 5–15)
BUN: 8 mg/dL (ref 6–20)
CHLORIDE: 100 mmol/L — AB (ref 101–111)
CO2: 27 mmol/L (ref 22–32)
CREATININE: 0.81 mg/dL (ref 0.44–1.00)
Calcium: 8.6 mg/dL — ABNORMAL LOW (ref 8.9–10.3)
GFR calc Af Amer: >60 mL/min (ref >60)
GFR calc non Af Amer: >60 mL/min (ref >60)
Glucose, Bld: 105 mg/dL — ABNORMAL HIGH (ref 65–99)
Potassium: 4.1 mmol/L (ref 3.5–5.1)
SODIUM: 135 mmol/L (ref 135–145)

## 2015-07-17 LAB — CBC
HEMATOCRIT: 32.6 % — AB (ref 36.0–46.0)
HEMOGLOBIN: 10.5 g/dL — AB (ref 12.0–15.0)
MCH: 28 pg (ref 26.0–34.0)
MCHC: 32.2 g/dL (ref 30.0–36.0)
MCV: 86.9 fL (ref 78.0–100.0)
Platelets: 313 10*3/uL (ref 150–400)
RBC: 3.75 MIL/uL — ABNORMAL LOW (ref 3.87–5.11)
RDW: 13.4 % (ref 11.5–15.5)
WBC: 11.7 10*3/uL — ABNORMAL HIGH (ref 4.0–10.5)

## 2015-07-17 MED ORDER — IBUPROFEN 800 MG PO TABS
800.0000 mg | ORAL_TABLET | Freq: Three times a day (TID) | ORAL | Status: DC
  Administered 2015-07-17 – 2015-07-18 (×5): 800 mg via ORAL
  Filled 2015-07-17 (×5): qty 1

## 2015-07-17 MED ORDER — ONDANSETRON HCL 4 MG PO TABS
4.0000 mg | ORAL_TABLET | Freq: Three times a day (TID) | ORAL | Status: DC | PRN
  Administered 2015-07-17: 4 mg via ORAL
  Filled 2015-07-17: qty 1

## 2015-07-17 MED ORDER — DIPHENHYDRAMINE HCL 25 MG PO CAPS
25.0000 mg | ORAL_CAPSULE | Freq: Four times a day (QID) | ORAL | Status: DC | PRN
  Administered 2015-07-17 – 2015-07-18 (×4): 25 mg via ORAL
  Filled 2015-07-17 (×4): qty 1

## 2015-07-17 MED ORDER — HYDROMORPHONE HCL 2 MG PO TABS
6.0000 mg | ORAL_TABLET | ORAL | Status: DC | PRN
  Administered 2015-07-17 – 2015-07-18 (×4): 6 mg via ORAL
  Filled 2015-07-17 (×5): qty 3

## 2015-07-17 MED ORDER — OXYCODONE-ACETAMINOPHEN 5-325 MG PO TABS
1.0000 | ORAL_TABLET | ORAL | Status: DC | PRN
  Administered 2015-07-17: 2 via ORAL
  Administered 2015-07-17: 1 via ORAL
  Filled 2015-07-17: qty 1
  Filled 2015-07-17: qty 2

## 2015-07-17 NOTE — Progress Notes (Addendum)
Laura Vargas is a89 y.o.  [id number]  Post Op Date # 1:  Laparoscopy, Abdominal Myomectomy and Ablation of Endometriosis  Subjective: Patient is Doing well postoperatively. Patient has Pain is controlled with current analgesics. Medications being used: narcotic analgesics including PCA morphine.Marland Kitchen  and Toradol.  Ambulated in halls without dizziness,  tolerating liquids with episodes of nausea but no vomiting.  Has  complaints of itching related to anesthesia but has received Benadryl with some relief.  Currently 200 cc of clear straw colored urine in Foley.   Objective: Vital signs in last 24 hours: Temp:  [97.7 F (36.5 C)-98.7 F (37.1 C)] 98.7 F (37.1 C) (03/23 0512) Pulse Rate:  [62-100] 67 (03/23 0512) Resp:  [9-27] 18 (03/23 0542) BP: (101-147)/(50-99) 115/57 mmHg (03/23 0512) SpO2:  [98 %-100 %] 98 % (03/23 0542) FiO2 (%):  [76 %] 76 % (03/23 0157)  Intake/Output from previous day: 03/22 0701 - 03/23 0700 In: 2921.2 [P.O.:817; I.V.:2104.2] Out: 2375 [Urine:2325] Intake/Output this shift: Total I/O In: 921.2 [P.O.:817; I.V.:104.2] Out: 1875 [Urine:1875]  Recent Labs Lab 07/17/15 0546  WBC 11.7*  HGB 10.5*  HCT 32.6*  PLT 313     Recent Labs Lab 07/17/15 0546  NA 135  K 4.1  CL 100*  CO2 27  BUN 8  CREATININE 0.81  CALCIUM 8.6*  GLUCOSE 105*    EXAM: General: alert, cooperative and mild distress Resp: clear to auscultation bilaterally Cardio: regular rate and rhythm, S1, S2 normal, no murmur, click, rub or gallop GI: Soft and hypoactive  bowel sounds in all 4 quadrants; dressings are clean/dry/intact. Extremities: Homans sign is negative, no sign of DVT and no calf tenderness.   Assessment: s/p Procedure(s): LAPAROSCOPY OPERATIVE, Chromopertubation LAPAROTOMY, Myomectomy: stable and anemia  Plan: Routine care  Per Dr. Alesia Richards  LOS: 1 day    POWELL,ELMIRA, PA-C 07/17/2015 6:44 AM  I saw and examined patient above and agree with above  findings, assessment and plan.  Patient denies any flatus yet, with good bowel sounds on all four quadrants, await flatus to advance diet to regular diet.  Tolerating  Clears liquid diet.  Pain now better controlled with oral dilaudid and ibuprofen, hold percocet.  Ambulating and voiding without difficulty.  May d/c IV fluids and IV access. She has scant vaginal bleeding.  I discussed with patient operation, intra-op findings, and post op follow up and care.  May get benadryl for itching, continue with close monitoring of BPs for next hour, pt. received dilaudid about 1 hr ago.  Dr. Alesia Richards. 07/17/15 @ 1817.

## 2015-07-18 MED ORDER — IBUPROFEN 800 MG PO TABS: ORAL_TABLET | ORAL | Status: DC

## 2015-07-18 MED ORDER — ONDANSETRON HCL 4 MG PO TABS: 4.0000 mg | ORAL_TABLET | Freq: Three times a day (TID) | ORAL | Status: DC | PRN

## 2015-07-18 MED ORDER — HYDROMORPHONE HCL 2 MG PO TABS
2.0000 mg | ORAL_TABLET | Freq: Once | ORAL | Status: AC
  Administered 2015-07-18: 2 mg via ORAL
  Filled 2015-07-18: qty 1

## 2015-07-18 MED ORDER — DIPHENHYDRAMINE HCL 25 MG PO CAPS: 25.0000 mg | ORAL_CAPSULE | Freq: Four times a day (QID) | ORAL | Status: DC | PRN

## 2015-07-18 MED ORDER — HYDROMORPHONE HCL 4 MG PO TABS: 4.0000 mg | ORAL_TABLET | ORAL | Status: DC | PRN

## 2015-07-18 NOTE — Discharge Instructions (Signed)
Call [facility] @ [phone] if:  You have a temperature greater than or equal to 100.4 degrees Farenheit orally You have pain that is not made better by the pain medication given and taken as directed You have excessive bleeding or problems urinating  Take Colace (Docusate Sodium/Stool Softener) 100 mg 2-3 times daily while taking narcotic pain medicine to avoid constipation or until bowel movements are regular. Take over the counter iron supplement of your choice,  twice a day for the next 12 weeks Take,  with food,  Ibuprofen 800 mg  every 8 hours for the first 5 days then as needed for pain.  You may drive after 2  weeks You may walk up steps slowly  You may shower  You may resume a regular diet  Keep incisions clean and dry;  Remove honeycomb dressing July 22, 2015 Do not lift over 15 pounds for 6 weeks Avoid anything in vagina for 6 weeks (or until after your post-operative visit)

## 2015-07-18 NOTE — Progress Notes (Signed)
Discharge instructions provided to patient and significant other at bedside.  Activity, medications, follow up appointments, when to call the doctor and community resources discussed.  No questions at this time.  Patient reports pain free at this time.  Patient left unit in stable condition with all personal belongings and prescriptions accompanied by staff.  Leighton Roach, RN------------------------

## 2015-07-18 NOTE — Discharge Summary (Signed)
Physician Discharge Summary  Patient ID: Laura Vargas MRN: [id number] DOB/AGE: 06/28/1990 25 y.o.  Admit date: 07/16/2015 Discharge date: 07/18/2015   Discharge Diagnoses:    Uterine Fibroids,  Chronic Pelvic Pain,  Infertility and Endometriosis Active Problems:   Status post myomectomy   Operation:   Laparoscopy,  Abdominal Myomectomy, Ablation of Endometriosis and Chromopertubation   Discharged Condition: Good  Hospital Course: On the date of admission the patient underwent the aforementioned procedures and tolerated them well.  Post operative course was unremarkable with the patient resuming bowel and bladder function by post operative day #2 and was therefore deemed ready for discharge home.  Discharge hemoglobin was 10.5.  Disposition: 01-Home or Self Care  Discharge Medications:    Medication List    STOP taking these medications        ketorolac 10 MG tablet  Commonly known as:  TORADOL     metroNIDAZOLE 500 MG tablet  Commonly known as:  FLAGYL     naproxen sodium 220 MG tablet  Commonly known as:  ANAPROX     oxyCODONE-acetaminophen 5-325 MG tablet  Commonly known as:  PERCOCET/ROXICET      TAKE these medications        HYDROmorphone 4 MG tablet  Commonly known as:  DILAUDID  Take 1 tablet (4 mg total) by mouth every 4 (four) hours as needed for severe pain.     ibuprofen 800 MG tablet  Commonly known as:  ADVIL,MOTRIN  1  po  pc every 8 hours for 5 days then as needed for pain     ondansetron 4 MG tablet  Commonly known as:  ZOFRAN  Take 1 tablet (4 mg total) by mouth every 8 (eight) hours as needed for nausea or vomiting.           Follow-up:    Dr. Waymon Amato on July 31, 2015 at 9:45 a.m.   SignedEarnstine Regal, PA-C 07/18/2015, 8:01 AM

## 2015-07-18 NOTE — Progress Notes (Signed)
Laura Vargas is a63 y.o.  [id number]  Post Op Date # 2:  Laparoscopy, Abdominal Myomectomy with Ablation of Endometriosis and Chromopertubation  Subjective: Patient is Doing well postoperatively. Patient has Pain is controlled with current analgesics. Medications being used: prescription NSAID's including Ibuprofen and narcotic analgesics including Dilaudid. Has passed flatus and tolerating a regular diet.  Ambulating in the halls without dizziness and voiding without difficulty.     Objective: Vital signs in last 24 hours: Temp:  [98.1 F (36.7 C)-98.7 F (37.1 C)] 98.5 F (36.9 C) (03/24 0529) Pulse Rate:  [59-67] 59 (03/24 0529) Resp:  [14-18] 14 (03/24 0529) BP: (91-122)/(34-69) 106/34 mmHg (03/24 0529) SpO2:  [98 %-100 %] 98 % (03/24 0529) Weight:  [259 lb (117.482 kg)] 259 lb (117.482 kg) (03/23 0900)  Intake/Output from previous day: 03/23 0701 - 03/24 0700 In: 2400 [I.V.:2400] Out: 1000 [Urine:1000] Intake/Output this shift:    Recent Labs Lab 07/17/15 0546  WBC 11.7*  HGB 10.5*  HCT 32.6*  PLT 313     Recent Labs Lab 07/17/15 0546  NA 135  K 4.1  CL 100*  CO2 27  BUN 8  CREATININE 0.81  CALCIUM 8.6*  GLUCOSE 105*    EXAM: General: alert, cooperative and no distress Resp: clear to auscultation bilaterally Cardio: regular rate and rhythm, S1, S2 normal, no murmur, click, rub or gallop GI: Bowel sounds present;  dressings are clean/dry/intact;  no evidence of infection seen through honeycomb dressing Extremities: Homans sign is negative, no sign of DVT and no calf tenderness.   Assessment: s/p Procedure(s): LAPAROSCOPY OPERATIVE, Chromopertubation LAPAROTOMY, Myomectomy: stable, progressing well and anemia  Plan: Discharge home  LOS: 2 days    POWELL,ELMIRA, PA-C 07/18/2015 7:42 AM  I saw and examined patient at bedside at about 10am 07/18/15 and agree with above findings, assessment and plan.  Patient complaining of incisional pain that  improves with pain medication use.  Discussed pain management and post-op course.  Will manage pain before discharge.  Ordered orthostatic BP checks that were normal (not orthostatic).  Dr. Alesia Richards.

## 2015-11-18 ENCOUNTER — Encounter (HOSPITAL_BASED_OUTPATIENT_CLINIC_OR_DEPARTMENT_OTHER): Payer: Self-pay | Admitting: *Deleted

## 2015-11-18 ENCOUNTER — Emergency Department (HOSPITAL_BASED_OUTPATIENT_CLINIC_OR_DEPARTMENT_OTHER)
Admission: EM | Admit: 2015-11-18 | Discharge: 2015-11-18 | Disposition: A | Discharge status: Home / Self Care | Payer: BLUE CROSS/BLUE SHIELD | Attending: Emergency Medicine | Admitting: Emergency Medicine

## 2015-11-18 DIAGNOSIS — F17210 Nicotine dependence, cigarettes, uncomplicated: Secondary | ICD-10-CM | POA: Diagnosis not present

## 2015-11-18 DIAGNOSIS — J069 Acute upper respiratory infection, unspecified: Secondary | ICD-10-CM | POA: Insufficient documentation

## 2015-11-18 DIAGNOSIS — R05 Cough: Secondary | ICD-10-CM | POA: Diagnosis present

## 2015-11-18 MED ORDER — PROMETHAZINE-DM 6.25-15 MG/5ML PO SYRP: 5.0000 mL | ORAL_SOLUTION | Freq: Four times a day (QID) | ORAL | 0 refills | Status: DC | PRN

## 2015-11-18 MED ORDER — PREDNISONE 20 MG PO TABS: 20.0000 mg | ORAL_TABLET | Freq: Two times a day (BID) | ORAL | 0 refills | Status: DC

## 2015-11-18 MED ORDER — ALBUTEROL SULFATE HFA 108 (90 BASE) MCG/ACT IN AERS: 1.0000 | INHALATION_SPRAY | Freq: Four times a day (QID) | RESPIRATORY_TRACT | 0 refills | Status: DC | PRN

## 2015-11-18 MED ORDER — BENZONATATE 100 MG PO CAPS: 100.0000 mg | ORAL_CAPSULE | Freq: Three times a day (TID) | ORAL | 0 refills | Status: DC

## 2015-11-18 MED FILL — BENZONATATE 100 MG CAPSULE: 100 | 7 days supply | Qty: 21 | Fill #0

## 2015-11-18 MED FILL — PROMETHAZINE-DM SYRUP: 6.25-15 | 4 days supply | Qty: 85 | Fill #0

## 2015-11-18 MED FILL — predniSONE 20 MG TABS: 20 | 5 days supply | Qty: 10 | Fill #0

## 2015-11-18 MED FILL — VENTOLIN HFA 90 MCG INHALER: 108 (90 BAS | 25 days supply | Qty: 18 | Fill #0

## 2015-11-18 NOTE — ED Triage Notes (Signed)
Pt c/o URI symptoms x 1 week 

## 2015-11-18 NOTE — ED Provider Notes (Signed)
[facility] Provider Note   CSN: [id number] Arrival date & time: 11/18/15  1413  First Provider Contact:  1515 p.m.      History   Chief Complaint Chief Complaint  Patient presents with  . URI    HPI Laura Vargas is a 25 y.o. female. She describes cough and cold symptoms for a week. Runny nose. Nocturnal cough. Thin yellow sputum. No fever. Does not feel short of breath. Feels like she cannot stop coughing.  No shortness of breath. No edema. No fevers. No sinus pain or pressure for some runny nose. No ear pain. No fevers or other symptoms.    HPI  Past Medical History:  Diagnosis Date  . Breast cyst   . Bronchitis   . Dysmenorrhea   . Fibroids   . Ovarian cyst   . Pyloric ulcer   . Shingles     Patient Active Problem List   Diagnosis Date Noted  . Status post myomectomy 07/16/2015  . Acute pelvic pain, female 05/29/2015  . Fibroids 05/29/2015  . Irregular menstrual cycle 03/20/2012  . Dysmenorrhea 03/20/2012    Past Surgical History:  Procedure Laterality Date  . BREAST LUMPECTOMY    . LAPAROSCOPY N/A 07/16/2015   Procedure: LAPAROSCOPY OPERATIVE, Chromopertubation;  Surgeon: Waymon Amato, MD;  Location: [facility];  Service: Gynecology;  Laterality: N/A;  . LAPAROTOMY N/A 07/16/2015   Procedure: LAPAROTOMY, Myomectomy;  Surgeon: Waymon Amato, MD;  Location: [facility];  Service: Gynecology;  Laterality: N/A;  . RECTAL POLYPECTOMY      OB History    Gravida Para Term Preterm AB Living   0             SAB TAB Ectopic Multiple Live Births                   Home Medications    Prior to Admission medications   Medication Sig Start Date End Date Taking? Authorizing Provider  albuterol (PROVENTIL HFA;VENTOLIN HFA) 108 (90 Base) MCG/ACT inhaler Inhale 1-2 puffs into the lungs every 6 (six) hours as needed for wheezing. 11/18/15   Tanna Furry, MD  benzonatate (TESSALON) 100 MG capsule Take 1 capsule (100 mg total) by mouth every 8 (eight) hours. 11/18/15    Tanna Furry, MD  predniSONE (DELTASONE) 20 MG tablet Take 1 tablet (20 mg total) by mouth 2 (two) times daily with a meal. 11/18/15   Tanna Furry, MD  promethazine-dextromethorphan (PROMETHAZINE-DM) 6.25-15 MG/5ML syrup Take 5 mLs by mouth 4 (four) times daily as needed for cough. 11/18/15   Tanna Furry, MD    Family History Family History  Problem Relation Age of Onset  . Hypertension Father     Social History Social History  Substance Use Topics  . Smoking status: Current Every Day Smoker    Packs/day: 0.25    Years: 6.00    Types: Cigarettes  . Smokeless tobacco: Never Used  . Alcohol use Yes     Comment: occ     Allergies   Review of patient's allergies indicates no known allergies.   Review of Systems Review of Systems  Constitutional: Negative for appetite change, chills, diaphoresis, fatigue and fever.  HENT: Positive for postnasal drip and rhinorrhea. Negative for mouth sores, sore throat and trouble swallowing.   Eyes: Negative for visual disturbance.  Respiratory: Positive for cough and wheezing. Negative for chest tightness and shortness of breath.   Cardiovascular: Negative for chest pain.  Gastrointestinal: Negative for abdominal distention, abdominal  pain, diarrhea, nausea and vomiting.  Endocrine: Negative for polydipsia, polyphagia and polyuria.  Genitourinary: Negative for dysuria, frequency and hematuria.  Musculoskeletal: Negative for gait problem.  Skin: Negative for color change, pallor and rash.  Neurological: Negative for dizziness, syncope, light-headedness and headaches.  Hematological: Does not bruise/bleed easily.  Psychiatric/Behavioral: Negative for behavioral problems and confusion.     Physical Exam Updated Vital Signs BP 117/61 (BP Location: Left Arm)   Pulse 90   Temp 98 F (36.7 C) (Oral)   Resp 20   Ht 5\' 6"  (1.676 m)   Wt 240 lb (108.9 kg)   LMP 11/11/2015   SpO2 100%   BMI 38.74 kg/m   Physical Exam  Constitutional: She is  oriented to person, place, and time. She appears well-developed and well-nourished. No distress.  HENT:  Head: Normocephalic.  Eyes: Conjunctivae are normal. Pupils are equal, round, and reactive to light. No scleral icterus.  Neck: Normal range of motion. Neck supple. No thyromegaly present.  Cardiovascular: Normal rate and regular rhythm.  Exam reveals no gallop and no friction rub.   No murmur heard. Pulmonary/Chest: Effort normal and breath sounds normal. No respiratory distress. She has no wheezes. She has no rales.  Bronchospastic cough. No wheezing or prolongation with normal breathing. No focal diminished breath sounds. Saturations 100%. No increased work of breathing or tachypnea.  Abdominal: Soft. Bowel sounds are normal. She exhibits no distension. There is no tenderness. There is no rebound.  Musculoskeletal: Normal range of motion.  Neurological: She is alert and oriented to person, place, and time.  Skin: Skin is warm and dry. No rash noted.  Psychiatric: She has a normal mood and affect. Her behavior is normal.     ED Treatments / Results  Labs (all labs ordered are listed, but only abnormal results are displayed) Labs Reviewed - No data to display  EKG  EKG Interpretation None       Radiology No results found.  Procedures Procedures (including critical care time)  Medications Ordered in ED Medications - No data to display   Initial Impression / Assessment and Plan / ED Course  I have reviewed the triage vital signs and the nursing notes.  Pertinent labs & imaging results that were available during my care of the patient were reviewed by me and considered in my medical decision making (see chart for details).  Clinical Course   Typical course of a viral upper respiratory infection discussed the patient. Discussed treatment with cough suppressants for symptoms. Otherwise expectant management.  Final Clinical Impressions(s) / ED Diagnoses   Final  diagnoses:  URI (upper respiratory infection)    New Prescriptions New Prescriptions   ALBUTEROL (PROVENTIL HFA;VENTOLIN HFA) 108 (90 BASE) MCG/ACT INHALER    Inhale 1-2 puffs into the lungs every 6 (six) hours as needed for wheezing.   BENZONATATE (TESSALON) 100 MG CAPSULE    Take 1 capsule (100 mg total) by mouth every 8 (eight) hours.   PREDNISONE (DELTASONE) 20 MG TABLET    Take 1 tablet (20 mg total) by mouth 2 (two) times daily with a meal.   PROMETHAZINE-DEXTROMETHORPHAN (PROMETHAZINE-DM) 6.25-15 MG/5ML SYRUP    Take 5 mLs by mouth 4 (four) times daily as needed for cough.     Tanna Furry, MD 11/18/15 [phone]

## 2015-12-14 ENCOUNTER — Encounter (HOSPITAL_BASED_OUTPATIENT_CLINIC_OR_DEPARTMENT_OTHER): Payer: Self-pay | Admitting: *Deleted

## 2015-12-14 ENCOUNTER — Emergency Department (HOSPITAL_BASED_OUTPATIENT_CLINIC_OR_DEPARTMENT_OTHER)
Admission: EM | Admit: 2015-12-14 | Discharge: 2015-12-14 | Disposition: A | Discharge status: Home / Self Care | Payer: BLUE CROSS/BLUE SHIELD | Attending: Emergency Medicine | Admitting: Emergency Medicine

## 2015-12-14 DIAGNOSIS — F17210 Nicotine dependence, cigarettes, uncomplicated: Secondary | ICD-10-CM | POA: Insufficient documentation

## 2015-12-14 DIAGNOSIS — R21 Rash and other nonspecific skin eruption: Secondary | ICD-10-CM | POA: Diagnosis present

## 2015-12-14 DIAGNOSIS — L259 Unspecified contact dermatitis, unspecified cause: Secondary | ICD-10-CM | POA: Insufficient documentation

## 2015-12-14 MED ORDER — PREDNISONE 10 MG PO TABS
60.0000 mg | ORAL_TABLET | Freq: Once | ORAL | Status: AC
  Administered 2015-12-14: 60 mg via ORAL
  Filled 2015-12-14: qty 1

## 2015-12-14 MED ORDER — PREDNISONE 20 MG PO TABS: ORAL_TABLET | ORAL | 0 refills | Status: DC

## 2015-12-14 NOTE — ED Provider Notes (Addendum)
[facility] Provider Note   CSN: [id number] Arrival date & time: 12/14/15  0229     History   Chief Complaint Chief Complaint  Patient presents with  . Other    rash    HPI Laura Vargas is a 25 y.o. female.   Rash   This is a new problem. The current episode started more than 2 days ago (Started Wednesday while sitting on boyfriend's couch). The problem has not changed since onset.The problem is associated with nothing. There has been no fever. The rash is present on the torso, back and right upper leg. The patient is experiencing no pain. The pain has been constant since onset. Associated symptoms include itching. Pertinent negatives include no weeping. Treatments tried: one dose of benadryl on Thursday. The treatment provided no relief. Risk factors include new environmental exposures.    Past Medical History:  Diagnosis Date  . Breast cyst   . Bronchitis   . Dysmenorrhea   . Fibroids   . Ovarian cyst   . Pyloric ulcer   . Shingles     Patient Active Problem List   Diagnosis Date Noted  . Status post myomectomy 07/16/2015  . Acute pelvic pain, female 05/29/2015  . Fibroids 05/29/2015  . Irregular menstrual cycle 03/20/2012  . Dysmenorrhea 03/20/2012    Past Surgical History:  Procedure Laterality Date  . BREAST LUMPECTOMY    . LAPAROSCOPY N/A 07/16/2015   Procedure: LAPAROSCOPY OPERATIVE, Chromopertubation;  Surgeon: Waymon Amato, MD;  Location: [facility];  Service: Gynecology;  Laterality: N/A;  . LAPAROTOMY N/A 07/16/2015   Procedure: LAPAROTOMY, Myomectomy;  Surgeon: Waymon Amato, MD;  Location: [facility];  Service: Gynecology;  Laterality: N/A;  . RECTAL POLYPECTOMY      OB History    Gravida Para Term Preterm AB Living   0             SAB TAB Ectopic Multiple Live Births                   Home Medications    Prior to Admission medications   Medication Sig Start Date End Date Taking? Authorizing Provider  albuterol (PROVENTIL HFA;VENTOLIN  HFA) 108 (90 Base) MCG/ACT inhaler Inhale 1-2 puffs into the lungs every 6 (six) hours as needed for wheezing. 11/18/15   Tanna Furry, MD  benzonatate (TESSALON) 100 MG capsule Take 1 capsule (100 mg total) by mouth every 8 (eight) hours. 11/18/15   Tanna Furry, MD  predniSONE (DELTASONE) 20 MG tablet Take 1 tablet (20 mg total) by mouth 2 (two) times daily with a meal. 11/18/15   Tanna Furry, MD  promethazine-dextromethorphan (PROMETHAZINE-DM) 6.25-15 MG/5ML syrup Take 5 mLs by mouth 4 (four) times daily as needed for cough. 11/18/15   Tanna Furry, MD    Family History Family History  Problem Relation Age of Onset  . Hypertension Father     Social History Social History  Substance Use Topics  . Smoking status: Current Every Day Smoker    Packs/day: 0.25    Years: 6.00    Types: Cigarettes  . Smokeless tobacco: Never Used  . Alcohol use Yes     Comment: occ     Allergies   Review of patient's allergies indicates no known allergies.   Review of Systems Review of Systems  Constitutional: Negative for fever.  HENT: Negative for congestion, drooling and facial swelling.   Respiratory: Negative for chest tightness, shortness of breath, wheezing and stridor.   Cardiovascular:  Negative for chest pain.  Skin: Positive for itching and rash.  All other systems reviewed and are negative.    Physical Exam Updated Vital Signs BP 137/80   Pulse 91   Temp 98.2 F (36.8 C) (Oral)   Resp 18   Ht 5\' 6"  (1.676 m)   Wt 240 lb (108.9 kg)   LMP 12/06/2015   SpO2 99%   BMI 38.74 kg/m   Physical Exam  Constitutional: She is oriented to person, place, and time. She appears well-developed and well-nourished. No distress.  HENT:  Head: Normocephalic and atraumatic.  Mouth/Throat: No oropharyngeal exudate.  No swelling of the lips tongue or uvula  Eyes: EOM are normal. Pupils are equal, round, and reactive to light.  Neck: Normal range of motion. Neck supple.  Cardiovascular: Normal rate  and regular rhythm.   Pulmonary/Chest: Effort normal and breath sounds normal. No stridor. No respiratory distress. She has no wheezes. She has no rales.  Abdominal: Soft. Bowel sounds are normal. There is no tenderness.  Musculoskeletal: Normal range of motion.  Neurological: She is alert and oriented to person, place, and time. She has normal reflexes.  Skin: Skin is warm and dry. Capillary refill takes less than 2 seconds.  Isolated wheal on the upper back.  One on the posterior right thigh one in the left axilla  Psychiatric: She has a normal mood and affect.     ED Treatments / Results  Labs (all labs ordered are listed, but only abnormal results are displayed) Labs Reviewed - No data to display  EKG  EKG Interpretation None       Radiology No results found.  Procedures Procedures (including critical care time)  Medications Ordered in ED Medications - No data to display   Initial Impression / Assessment and Plan / ED Course  I have reviewed the triage vital signs and the nursing notes.  Pertinent labs & imaging results that were available during my care of the patient were reviewed by me and considered in my medical decision making (see chart for details).  Clinical Course    Vitals:   12/14/15 0256  BP: 137/80  Pulse: 91  Resp: 18  Temp: 98.2 F (36.8 C)   Medications  predniSONE (DELTASONE) tablet 60 mg (not administered)     Final Clinical Impressions(s) / ED Diagnoses   Final diagnoses:  None    New Prescriptions New Prescriptions   No medications on file  Continue benadryl every 6 hours.  No driving while taking this medication.  No alcohol.  Avoid the offending couch.  Take all prednisone.  All questions answered to patient's satisfaction. Based on history and exam patient has been appropriately medically screened and emergency conditions excluded. Patient is stable for discharge at this time. Follow up with your PMDfor recheck in 2 daysand  strict return precautions given.    Veatrice Kells, MD 12/14/15 [id number]    Veatrice Kells, MD 12/14/15 0530

## 2015-12-14 NOTE — ED Triage Notes (Signed)
C/o "rash" that started on Wednesday that started on her right leg and then states "rash" has spread. A few small welts noted to pt's right thigh and left arm and upper back area. Faint red in color. States areas itch. Denies any new meds or cleaning products or foods. States the only thing new is laying on her boyfriends couch. Took benadryl on Thursday. Denies any sob. Just itching all over

## 2015-12-14 NOTE — ED Notes (Signed)
MD with pt  

## 2016-02-27 ENCOUNTER — Emergency Department (HOSPITAL_BASED_OUTPATIENT_CLINIC_OR_DEPARTMENT_OTHER)
Admission: EM | Admit: 2016-02-27 | Discharge: 2016-02-27 | Disposition: A | Discharge status: Home / Self Care | Payer: BLUE CROSS/BLUE SHIELD | Attending: Emergency Medicine | Admitting: Emergency Medicine

## 2016-02-27 ENCOUNTER — Encounter (HOSPITAL_BASED_OUTPATIENT_CLINIC_OR_DEPARTMENT_OTHER): Payer: Self-pay | Admitting: *Deleted

## 2016-02-27 DIAGNOSIS — J40 Bronchitis, not specified as acute or chronic: Secondary | ICD-10-CM | POA: Insufficient documentation

## 2016-02-27 DIAGNOSIS — F17210 Nicotine dependence, cigarettes, uncomplicated: Secondary | ICD-10-CM | POA: Diagnosis not present

## 2016-02-27 DIAGNOSIS — R05 Cough: Secondary | ICD-10-CM | POA: Diagnosis present

## 2016-02-27 MED ORDER — PROMETHAZINE-DM 6.25-15 MG/5ML PO SYRP: 5.0000 mL | ORAL_SOLUTION | Freq: Four times a day (QID) | ORAL | 0 refills | Status: DC | PRN

## 2016-02-27 MED ORDER — ALBUTEROL SULFATE HFA 108 (90 BASE) MCG/ACT IN AERS
2.0000 | INHALATION_SPRAY | RESPIRATORY_TRACT | Status: DC | PRN
  Administered 2016-02-27: 2 via RESPIRATORY_TRACT
  Filled 2016-02-27: qty 6.7

## 2016-02-27 MED ORDER — ALBUTEROL SULFATE (2.5 MG/3ML) 0.083% IN NEBU
2.5000 mg | INHALATION_SOLUTION | Freq: Once | RESPIRATORY_TRACT | Status: AC
  Administered 2016-02-27: 2.5 mg via RESPIRATORY_TRACT
  Filled 2016-02-27: qty 3

## 2016-02-27 NOTE — Discharge Instructions (Signed)
You were seen tonight from some bronchitis symptoms. You were given an albuterol breathing treatment for some wheezing in your lungs, your wheezing improved after the breathing treatment. If you have any shortness of breath or wheezing, please use the albuterol inhaler that we provided for you. Please return if you have worsening shortness of breath or wheezing. You can take Tylenol as needed for pain. A prescription was sent to your pharmacy for cough syrup. Please use this as prescribed on the bottle. It is important for you to never smoke cigarettes and to not be around people who smoke cigarettes.  Take care!

## 2016-02-27 NOTE — ED Triage Notes (Signed)
States that she was dx with bronchitis and now has a cough.

## 2016-02-27 NOTE — ED Provider Notes (Signed)
[name]   CSN: [id number] Arrival date & time: 02/27/16  2107   History   Chief Complaint Chief Complaint  Patient presents with  . Bronchitis    HPI  Laura Vargas is a 25 y.o. Female who presents with scratchy throat, nasal congestion, losing voice, productive cough, generalized body aches, sore ears, shortness of breath x 1 day. Her symptoms started around 10 PM last night. She tried cough syrup, not sure what kind which helped a little bit. No sick contacts. Denies any fever. Cough is productive of clear/yellow mucus. Cold beverages seem to help her feel better. Has not had much of an appetite. She has some trouble breathing when laying flat or coughing. No sick contacts. She does smoke cigarettes daily.   Past Medical History:  Diagnosis Date  . Breast cyst   . Bronchitis   . Dysmenorrhea   . Fibroids   . Ovarian cyst   . Pyloric ulcer   . Shingles     Patient Active Problem List   Diagnosis Date Noted  . Status post myomectomy 07/16/2015  . Acute pelvic pain, female 05/29/2015  . Fibroids 05/29/2015  . Irregular menstrual cycle 03/20/2012  . Dysmenorrhea 03/20/2012    Past Surgical History:  Procedure Laterality Date  . BREAST LUMPECTOMY    . LAPAROSCOPY N/A 07/16/2015   Procedure: LAPAROSCOPY OPERATIVE, Chromopertubation;  Surgeon: Waymon Amato, MD;  Location: [facility];  Service: Gynecology;  Laterality: N/A;  . LAPAROTOMY N/A 07/16/2015   Procedure: LAPAROTOMY, Myomectomy;  Surgeon: Waymon Amato, MD;  Location: [facility];  Service: Gynecology;  Laterality: N/A;  . RECTAL POLYPECTOMY      OB History    Gravida Para Term Preterm AB Living   0             SAB TAB Ectopic Multiple Live Births                   Home Medications    Prior to Admission medications   Medication Sig Start Date End Date Taking? Authorizing Provider  promethazine-dextromethorphan (PROMETHAZINE-DM) 6.25-15 MG/5ML syrup Take 5 mLs by mouth 4 (four) times daily  as needed for cough. 02/27/16   Carlyle Dolly, MD    Family History Family History  Problem Relation Age of Onset  . Hypertension Father     Social History Social History  Substance Use Topics  . Smoking status: Current Every Day Smoker    Packs/day: 0.25    Years: 6.00    Types: Cigarettes  . Smokeless tobacco: Never Used  . Alcohol use Yes     Comment: occ     Allergies   Review of patient's allergies indicates no known allergies.   Review of Systems Review of Systems  Constitutional: Positive for fatigue. Negative for chills and fever.  HENT: Positive for congestion and rhinorrhea. Negative for ear discharge, nosebleeds, sinus pressure, sneezing and sore throat.   Eyes: Negative for pain and discharge.  Respiratory: Positive for cough and shortness of breath. Negative for chest tightness and wheezing.   Cardiovascular: Negative for chest pain and leg swelling.  Gastrointestinal: Negative for abdominal pain, constipation, diarrhea, nausea and vomiting.  Genitourinary: Negative for dysuria.  Musculoskeletal: Negative for back pain.  Skin: Negative for rash.  Neurological: Negative for dizziness and headaches.     Physical Exam Updated Vital Signs BP 123/74 (BP Location: Right Arm)   Pulse 95   Temp 98.1 F (36.7 C) (Oral)  Resp 18   Ht 5\' 6"  (1.676 m)   Wt 108.9 kg   LMP 02/08/2016   SpO2 99%   BMI 38.74 kg/m   Physical Exam  Constitutional: She is oriented to person, place, and time. She appears well-developed and well-nourished.  HENT:  Head: Normocephalic and atraumatic.  Right Ear: External ear normal.  Left Ear: External ear normal.  Nose: Nose normal.  Mouth/Throat: Uvula is midline, oropharynx is clear and moist and mucous membranes are normal. No oropharyngeal exudate.  Eyes: EOM are normal. Pupils are equal, round, and reactive to light.  Neck: Normal range of motion. Neck supple.  Cardiovascular: Normal rate, regular rhythm, normal  heart sounds and intact distal pulses.   Pulmonary/Chest: Effort normal. No respiratory distress. She has wheezes (expiratory wheezing in left lower lung field). She exhibits no tenderness.  Abdominal: Soft. Bowel sounds are normal. She exhibits no distension. There is no tenderness.  Musculoskeletal: Normal range of motion. She exhibits no edema.  Neurological: She is alert and oriented to person, place, and time. She exhibits normal muscle tone.  Skin: Skin is warm. Capillary refill takes less than 2 seconds. No rash noted.  Psychiatric: She has a normal mood and affect.     ED Treatments / Results  Labs (all labs ordered are listed, but only abnormal results are displayed) Labs Reviewed - No data to display  EKG  EKG Interpretation None       Radiology No results found.  Procedures Procedures (including critical care time)  Medications Ordered in ED Medications  albuterol (PROVENTIL HFA;VENTOLIN HFA) 108 (90 Base) MCG/ACT inhaler 2 puff (2 puffs Inhalation Given 02/27/16 2240)  albuterol (PROVENTIL) (2.5 MG/3ML) 0.083% nebulizer solution 2.5 mg (2.5 mg Nebulization Given 02/27/16 2227)     Initial Impression / Assessment and Plan / ED Course  I have reviewed the triage vital signs and the nursing notes.  Pertinent labs & imaging results that were available during my care of the patient were reviewed by me and considered in my medical decision making (see chart for details).  Clinical Course    Final Clinical Impressions(s) / ED Diagnoses   Final diagnoses:  Bronchitis  Patient presented to the ED with symptoms consistent with bronchitis. No hx of asthma. Vital signs normal. Physical exam notable for wheezing in the left lung. Albuterol nebulizer treatment x 1 given which resolved wheezing. Albuterol inhaler given to patient to take home in the event that she has further wheezing. Prescription also given for Promethazine-DM cough syrup. Given that patient's symptoms  have only been occurring for 1 day and her vital signs are normal, held off an any imaging at this point. Patient stable for discharge. Return precautions discussed.   New Prescriptions Discharge Medication List as of 02/27/2016 11:20 PM    START taking these medications   Details  promethazine-dextromethorphan (PROMETHAZINE-DM) 6.25-15 MG/5ML syrup Take 5 mLs by mouth 4 (four) times daily as needed for cough., Starting Fri 02/27/2016, Normal         Carlyle Dolly, MD 02/28/16 0025    Veryl Speak, MD 02/29/16 2034

## 2016-02-29 ENCOUNTER — Emergency Department (HOSPITAL_BASED_OUTPATIENT_CLINIC_OR_DEPARTMENT_OTHER): Payer: BLUE CROSS/BLUE SHIELD

## 2016-02-29 ENCOUNTER — Encounter (HOSPITAL_BASED_OUTPATIENT_CLINIC_OR_DEPARTMENT_OTHER): Payer: Self-pay | Admitting: Emergency Medicine

## 2016-02-29 ENCOUNTER — Emergency Department (HOSPITAL_BASED_OUTPATIENT_CLINIC_OR_DEPARTMENT_OTHER)
Admission: EM | Admit: 2016-02-29 | Discharge: 2016-03-01 | Disposition: A | Discharge status: Home / Self Care | Payer: BLUE CROSS/BLUE SHIELD | Attending: Emergency Medicine | Admitting: Emergency Medicine

## 2016-02-29 DIAGNOSIS — J40 Bronchitis, not specified as acute or chronic: Secondary | ICD-10-CM | POA: Insufficient documentation

## 2016-02-29 DIAGNOSIS — F17210 Nicotine dependence, cigarettes, uncomplicated: Secondary | ICD-10-CM | POA: Insufficient documentation

## 2016-02-29 DIAGNOSIS — R079 Chest pain, unspecified: Secondary | ICD-10-CM | POA: Diagnosis present

## 2016-02-29 LAB — CBC WITH DIFFERENTIAL/PLATELET
Basophils Absolute: 0 10*3/uL (ref 0.0–0.1)
Basophils Relative: 0 %
EOS ABS: 0.2 10*3/uL (ref 0.0–0.7)
EOS PCT: 2 %
HCT: 34.7 % — ABNORMAL LOW (ref 36.0–46.0)
HEMOGLOBIN: 11.2 g/dL — AB (ref 12.0–15.0)
LYMPHS ABS: 2.7 10*3/uL (ref 0.7–4.0)
Lymphocytes Relative: 28 %
MCH: 27.1 pg (ref 26.0–34.0)
MCHC: 32.3 g/dL (ref 30.0–36.0)
MCV: 83.8 fL (ref 78.0–100.0)
MONO ABS: 1.1 10*3/uL — AB (ref 0.1–1.0)
MONOS PCT: 11 %
Neutro Abs: 5.5 10*3/uL (ref 1.7–7.7)
Neutrophils Relative %: 59 %
PLATELETS: 281 10*3/uL (ref 150–400)
RBC: 4.14 MIL/uL (ref 3.87–5.11)
RDW: 14.5 % (ref 11.5–15.5)
WBC: 9.4 10*3/uL (ref 4.0–10.5)

## 2016-02-29 LAB — BASIC METABOLIC PANEL
Anion gap: 7 (ref 5–15)
BUN: 8 mg/dL (ref 6–20)
CHLORIDE: 105 mmol/L (ref 101–111)
CO2: 25 mmol/L (ref 22–32)
CREATININE: 0.76 mg/dL (ref 0.44–1.00)
Calcium: 8.9 mg/dL (ref 8.9–10.3)
GFR calc Af Amer: >60 mL/min (ref >60)
GFR calc non Af Amer: >60 mL/min (ref >60)
Glucose, Bld: 89 mg/dL (ref 65–99)
Potassium: 3.6 mmol/L (ref 3.5–5.1)
SODIUM: 137 mmol/L (ref 135–145)

## 2016-02-29 LAB — D-DIMER, QUANTITATIVE: D-Dimer, Quant: <0.27 ug/mL-FEU (ref 0.00–0.50)

## 2016-02-29 LAB — TROPONIN I

## 2016-02-29 MED ORDER — ALBUTEROL SULFATE (2.5 MG/3ML) 0.083% IN NEBU
5.0000 mg | INHALATION_SOLUTION | Freq: Once | RESPIRATORY_TRACT | Status: AC
  Administered 2016-02-29: 5 mg via RESPIRATORY_TRACT
  Filled 2016-02-29: qty 6

## 2016-02-29 MED ORDER — IPRATROPIUM-ALBUTEROL 0.5-2.5 (3) MG/3ML IN SOLN
3.0000 mL | Freq: Once | RESPIRATORY_TRACT | Status: AC
  Administered 2016-02-29: 3 mL via RESPIRATORY_TRACT
  Filled 2016-02-29: qty 3

## 2016-02-29 NOTE — ED Triage Notes (Signed)
Pt in c/o continued cough and chest pain after workup here x 3 days ago. Prescribed cough med and inhaler but states chest is still hurting. Pt alert, interactive, ambulatory in NAD.

## 2016-02-29 NOTE — ED Provider Notes (Signed)
[facility] DEPT [facility] Provider Note   CSN: [id number] Arrival date & time: 02/29/16  2109  By signing my name below, I, [name], attest that this documentation has been prepared under the direction and in the presence of Harlene Ramus, [place]. Electronically Signed: [name], Scribe. 02/29/2016. 10:49 PM.  History   Chief Complaint Chief Complaint  Patient presents with  . Cough  . Chest Pain    The history is provided by the patient. No language interpreter was used.     HPI Comments: Laura Vargas is a 25 y.o. female with PMHx significant for bronchitis who presents to the Emergency Department complaining of constant, worsening, central chest pain beginning 2 days ago. Pt states it feels like someone is sitting on her chest and notes it is worse with taking a deep breath. She reports associated SOB, wheezing, productive cough with yellow mucous, congestion, rhinorrhea, bilateral ear pain, headache, and generalized myalgias. She states she measured a fever of 101 last night that broke this morning. Pt reports she was seen here for the same 2 days ago and was diagnosed with bronchitis and prescribed Promethazine and an inhaler; she states the inhaler provides quick temporary relief of her SOB. Pt has not tried any medications or treatments other than what she was prescribed two days ago. She smokes < 0.5 ppd but reports she hasn't smoked since onset of her symptoms. No known sick contacts. No regular medications. No FMHx of heart disease. No personal h/o diabetes, HTN, HLD, or blood clot. No recent surgery, immobilization, birth control use, or hospitalization. She denies sore throat, sinus pressure, hemoptysis, abdominal pain, nausea, vomiting, leg swelling, or any other associated symptoms. She reports last using albuterol inhaler around 8pm.  Past Medical History:  Diagnosis Date  . Breast cyst   . Bronchitis   . Dysmenorrhea   . Fibroids   . Ovarian cyst   . Pyloric  ulcer   . Shingles     Patient Active Problem List   Diagnosis Date Noted  . Status post myomectomy 07/16/2015  . Acute pelvic pain, female 05/29/2015  . Fibroids 05/29/2015  . Irregular menstrual cycle 03/20/2012  . Dysmenorrhea 03/20/2012    Past Surgical History:  Procedure Laterality Date  . BREAST LUMPECTOMY    . LAPAROSCOPY N/A 07/16/2015   Procedure: LAPAROSCOPY OPERATIVE, Chromopertubation;  Surgeon: Waymon Amato, MD;  Location: [facility];  Service: Gynecology;  Laterality: N/A;  . LAPAROTOMY N/A 07/16/2015   Procedure: LAPAROTOMY, Myomectomy;  Surgeon: Waymon Amato, MD;  Location: [facility];  Service: Gynecology;  Laterality: N/A;  . RECTAL POLYPECTOMY      OB History    Gravida Para Term Preterm AB Living   0             SAB TAB Ectopic Multiple Live Births                   Home Medications    Prior to Admission medications   Medication Sig Start Date End Date Taking? Authorizing Provider  benzonatate (TESSALON) 100 MG capsule Take 2 capsules (200 mg total) by mouth 2 (two) times daily as needed for cough. 03/01/16   Nona Dell, PA-C  oxymetazoline (AFRIN NASAL SPRAY) 0.05 % nasal spray Place 1 spray into both nostrils 2 (two) times daily. Do not use for more than 3 days to prevent rebound rhinorrhea. 03/01/16   Nona Dell, PA-C  predniSONE (DELTASONE) 20 MG tablet Take 2 tablets (40 mg total)  by mouth daily. 03/01/16   Nona Dell, PA-C  promethazine-dextromethorphan (PROMETHAZINE-DM) 6.25-15 MG/5ML syrup Take 5 mLs by mouth 4 (four) times daily as needed for cough. 02/27/16   Carlyle Dolly, MD    Family History Family History  Problem Relation Age of Onset  . Hypertension Father     Social History Social History  Substance Use Topics  . Smoking status: Current Every Day Smoker    Packs/day: 0.25    Years: 6.00    Types: Cigarettes  . Smokeless tobacco: Never Used  . Alcohol use Yes     Comment: occ     Allergies     Patient has no known allergies.   Review of Systems Review of Systems  Constitutional: Positive for fever (resolved).  HENT: Positive for congestion, ear pain (bilateral) and rhinorrhea. Negative for sinus pressure and sore throat.   Respiratory: Positive for cough (productive with yellow mucous), shortness of breath and wheezing.        Negative for hemoptysis.  Cardiovascular: Positive for chest pain. Negative for leg swelling.  Gastrointestinal: Negative for abdominal pain, nausea and vomiting.  Musculoskeletal: Positive for myalgias (generalized).  Neurological: Positive for headaches.  All other systems reviewed and are negative.   Physical Exam Updated Vital Signs BP 105/71   Pulse (!) 150   Temp 99.1 F (37.3 C) (Oral)   Resp 21   Ht 5\' 6"  (1.676 m)   Wt 108.9 kg   LMP 02/08/2016   SpO2 100%   BMI 38.74 kg/m   Physical Exam  Constitutional: She is oriented to person, place, and time. She appears well-developed and well-nourished. No distress.  HENT:  Head: Normocephalic and atraumatic.  Right Ear: External ear normal.  Left Ear: External ear normal.  Nose: Nose normal.  Mouth/Throat: Uvula is midline, oropharynx is clear and moist and mucous membranes are normal. No oropharyngeal exudate, posterior oropharyngeal edema, posterior oropharyngeal erythema or tonsillar abscesses. No tonsillar exudate.  Eyes: Conjunctivae and EOM are normal. Pupils are equal, round, and reactive to light. Right eye exhibits no discharge. Left eye exhibits no discharge. No scleral icterus.  Neck: Normal range of motion. Neck supple.  Cardiovascular: Normal rate, regular rhythm, normal heart sounds and intact distal pulses.   Heart rate 98  Pulmonary/Chest: Effort normal. No respiratory distress. She has wheezes (diffuse expiratory wheezing throughout, worse in basilar lobes). She has no rales. She exhibits tenderness (anterior chest wall tender to palpation).  Abdominal: Soft. Bowel  sounds are normal. She exhibits no distension and no mass. There is no tenderness. There is no rebound and no guarding.  Musculoskeletal: Normal range of motion. She exhibits no edema.  Lymphadenopathy:    She has no cervical adenopathy.  Neurological: She is alert and oriented to person, place, and time.  Skin: Skin is warm and dry. Capillary refill takes less than 2 seconds. She is not diaphoretic.  Nursing note and vitals reviewed.   ED Treatments / Results  Labs (all labs ordered are listed, but only abnormal results are displayed) Labs Reviewed  CBC WITH DIFFERENTIAL/PLATELET - Abnormal; Notable for the following:       Result Value   Hemoglobin 11.2 (*)    HCT 34.7 (*)    Monocytes Absolute 1.1 (*)    All other components within normal limits  BASIC METABOLIC PANEL  TROPONIN I  D-DIMER, QUANTITATIVE (NOT AT [facility])    EKG  EKG Interpretation  Date/Time:  Sunday February 29 2016 21:20:08 EST  Ventricular Rate:  103 PR Interval:  142 QRS Duration: 80 QT Interval:  318 QTC Calculation: 416 R Axis:   83 Text Interpretation:  Sinus tachycardia Nonspecific T wave abnormality Abnormal ECG No old tracing to compare Confirmed by [facility] MD, [place] [phone]) on 03/01/2016 12:03:01 AM       Radiology Dg Chest 2 View  Result Date: 02/29/2016 CLINICAL DATA:  Continued cough, wheezing, and chest pain after workup here 3 days ago. Smoker. EXAM: CHEST  2 VIEW COMPARISON:  09/10/2015 FINDINGS: The heart size and mediastinal contours are within normal limits. Both lungs are clear. The visualized skeletal structures are unremarkable. IMPRESSION: No active cardiopulmonary disease. Electronically Signed   By: Lucienne Capers M.D.   On: 02/29/2016 23:20    Procedures Procedures (including critical care time)  DIAGNOSTIC STUDIES: Oxygen Saturation is 98% on RA, normal by my interpretation.    COORDINATION OF CARE: 10:58 PM Discussed treatment plan with pt at bedside and pt agreed to  plan.  Medications Ordered in ED Medications  ipratropium-albuterol (DUONEB) 0.5-2.5 (3) MG/3ML nebulizer solution 3 mL (3 mLs Nebulization Given 02/29/16 2316)  albuterol (PROVENTIL) (2.5 MG/3ML) 0.083% nebulizer solution 5 mg (5 mg Nebulization Given 02/29/16 2323)  albuterol (PROVENTIL) (2.5 MG/3ML) 0.083% nebulizer solution 5 mg (5 mg Nebulization Given 02/29/16 2357)  methylPREDNISolone sodium succinate (SOLU-MEDROL) 125 mg/2 mL injection 125 mg (125 mg Intravenous Given 03/01/16 0043)  benzonatate (TESSALON) capsule 200 mg (200 mg Oral Given 03/01/16 0043)     Initial Impression / Assessment and Plan / ED Course  I have reviewed the triage vital signs and the nursing notes.  Pertinent labs & imaging results that were available during my care of the patient were reviewed by me and considered in my medical decision making (see chart for details).  Clinical Course     Patient presents with constant chest pressure that has been present for the past 2 days with associated shortness of breath, wheezing, nasal congestion and cough. She notes she was seen in the ED 2 days ago, diagnosed with bronchitis and discharged home with cough syrup and albuterol inhaler. Endorses smoking. Initial vitals performed in triage revealed heart rate 120, remaining vitals stable. On my initial valuation, expiratory wheezes noted throughout, worse in basilar lobes, no evidence of respiratory distress. Anterior chest wall tender to palpation. Remaining exam unremarkable. Patient given neb treatments and steroids.  EKG showed sinus tachycardia, heart rate 103, nonspecific T wave abnormality, no acute ischemic changes noted. CXR negative. Trop negative. Labs unremarkable. Due to pt initially being tachycardic on initial evaluation, will order d-dimer to evaluate for PE. D-dimer negative. I have a low suspicion for ACS, PE, dissection, or other acute cardiac event at this time. Suspect pt's sxs are likely due to viral  bronchitis. On reevaluation patient reports improvement of her breathing. Reevaluation revealed significant improvement of wheezing throughout. Plan to discharge patient home with steroids, antitussive and decongestion. Advised patient to continue using her albuterol inhaler as prescribed as needed for wheezing and shortness of breath. Advised patient to follow up with her PCP within the next week. Discussed strict return precautions.  I personally performed the services described in this documentation, which was scribed in my presence. The recorded information has been reviewed and is accurate.   Final Clinical Impressions(s) / ED Diagnoses   Final diagnoses:  Bronchitis    New Prescriptions New Prescriptions   BENZONATATE (TESSALON) 100 MG CAPSULE    Take 2 capsules (200 mg total) by  mouth 2 (two) times daily as needed for cough.   OXYMETAZOLINE (AFRIN NASAL SPRAY) 0.05 % NASAL SPRAY    Place 1 spray into both nostrils 2 (two) times daily. Do not use for more than 3 days to prevent rebound rhinorrhea.   PREDNISONE (DELTASONE) 20 MG TABLET    Take 2 tablets (40 mg total) by mouth daily.     Chesley Noon [place], [place] 03/01/16 0101    Fatima Blank, MD 03/01/16 [phone]

## 2016-03-01 MED ORDER — BENZONATATE 100 MG PO CAPS
200.0000 mg | ORAL_CAPSULE | Freq: Once | ORAL | Status: AC
  Administered 2016-03-01: 200 mg via ORAL
  Filled 2016-03-01: qty 2

## 2016-03-01 MED ORDER — METHYLPREDNISOLONE SODIUM SUCC 125 MG IJ SOLR
125.0000 mg | Freq: Once | INTRAMUSCULAR | Status: AC
  Administered 2016-03-01: 125 mg via INTRAVENOUS
  Filled 2016-03-01: qty 2

## 2016-03-01 MED ORDER — PREDNISONE 20 MG PO TABS: 40.0000 mg | ORAL_TABLET | Freq: Every day | ORAL | 0 refills | Status: DC

## 2016-03-01 MED ORDER — BENZONATATE 100 MG PO CAPS: 200.0000 mg | ORAL_CAPSULE | Freq: Two times a day (BID) | ORAL | 0 refills | Status: DC | PRN

## 2016-03-01 MED ORDER — OXYMETAZOLINE HCL 0.05 % NA SOLN: 1.0000 | Freq: Two times a day (BID) | NASAL | 0 refills | Status: DC

## 2016-03-01 NOTE — Discharge Instructions (Signed)
Take your medications as prescribed. I recommend continuing to use her albuterol inhaler as prescribed as needed for wheezing/shortness of breath. Continue drinking fluids at home to remain hydrated. You may also take Tylenol and/or ibuprofen as prescribed over-the-counter as needed for pain relief. Please follow up with a primary care provider from the Resource Guide provided below in one week if her symptoms have not improved. Please return to the Emergency Department if symptoms worsen or new onset of fever, headache, lightheadedness, chest pain, difficulty breathing, coughing up blood, vomiting, unable to keep fluids down, abdominal pain, facial swelling, neck stiffness.

## 2016-04-10 ENCOUNTER — Encounter (HOSPITAL_BASED_OUTPATIENT_CLINIC_OR_DEPARTMENT_OTHER): Payer: Self-pay | Admitting: Emergency Medicine

## 2016-04-10 ENCOUNTER — Emergency Department (HOSPITAL_BASED_OUTPATIENT_CLINIC_OR_DEPARTMENT_OTHER)
Admission: EM | Admit: 2016-04-10 | Discharge: 2016-04-10 | Disposition: A | Discharge status: Home / Self Care | Payer: BLUE CROSS/BLUE SHIELD | Attending: Emergency Medicine | Admitting: Emergency Medicine

## 2016-04-10 DIAGNOSIS — N898 Other specified noninflammatory disorders of vagina: Secondary | ICD-10-CM

## 2016-04-10 DIAGNOSIS — F17210 Nicotine dependence, cigarettes, uncomplicated: Secondary | ICD-10-CM | POA: Diagnosis not present

## 2016-04-10 DIAGNOSIS — B9689 Other specified bacterial agents as the cause of diseases classified elsewhere: Secondary | ICD-10-CM

## 2016-04-10 DIAGNOSIS — N760 Acute vaginitis: Secondary | ICD-10-CM | POA: Diagnosis not present

## 2016-04-10 DIAGNOSIS — R102 Pelvic and perineal pain: Secondary | ICD-10-CM | POA: Diagnosis present

## 2016-04-10 LAB — URINALYSIS, ROUTINE W REFLEX MICROSCOPIC
BILIRUBIN URINE: NEGATIVE
Glucose, UA: NEGATIVE mg/dL
Hgb urine dipstick: NEGATIVE
KETONES UR: NEGATIVE mg/dL
LEUKOCYTES UA: NEGATIVE
NITRITE: NEGATIVE
PROTEIN: NEGATIVE mg/dL
Specific Gravity, Urine: 1.025 (ref 1.005–1.030)
pH: 7 (ref 5.0–8.0)

## 2016-04-10 LAB — WET PREP, GENITAL
Sperm: NONE SEEN
TRICH WET PREP: NONE SEEN
Yeast Wet Prep HPF POC: NONE SEEN

## 2016-04-10 LAB — PREGNANCY, URINE: PREG TEST UR: NEGATIVE

## 2016-04-10 MED ORDER — METRONIDAZOLE 500 MG PO TABS: 500.0000 mg | ORAL_TABLET | Freq: Two times a day (BID) | ORAL | 0 refills | Status: DC

## 2016-04-10 NOTE — ED Provider Notes (Signed)
[facility] DEPT [facility] Provider Note   CSN: [id number] Arrival date & time: 04/10/16  1417  By signing my name below, I, Gwenlyn Fudge, attest that this documentation has been prepared under the direction and in the presence of Quincy Carnes, PA-C. Electronically Signed: Gwenlyn Fudge, ED Scribe. 04/10/16. 4:08 PM.  History   Chief Complaint Chief Complaint  Patient presents with  . Vaginal Pain   The history is provided by the patient. No language interpreter was used.   HPI Comments: Laura Vargas is a 25 y.o. female who presents to the Emergency Department complaining of gradual onset, constant vaginal irrigation onset 2 days ago.  Pt began having a constant tingling sensation that began during intercourse. Pt reports associated thick vaginal discharge that is similar to discharge with a yeast infection. Pt currently has 1 partner-- has been same partner for years.  No hx of STD in the past.   Pt denies vaginal bleeding, abdominal pain, nausea, vomiting, fever, chills, sweats, urinary symptoms.  No treatment tried PTA.  Past Medical History:  Diagnosis Date  . Breast cyst   . Bronchitis   . Dysmenorrhea   . Fibroids   . Ovarian cyst   . Pyloric ulcer   . Shingles     Patient Active Problem List   Diagnosis Date Noted  . Status post myomectomy 07/16/2015  . Acute pelvic pain, female 05/29/2015  . Fibroids 05/29/2015  . Irregular menstrual cycle 03/20/2012  . Dysmenorrhea 03/20/2012    Past Surgical History:  Procedure Laterality Date  . BREAST LUMPECTOMY    . LAPAROSCOPY N/A 07/16/2015   Procedure: LAPAROSCOPY OPERATIVE, Chromopertubation;  Surgeon: Waymon Amato, MD;  Location: [facility];  Service: Gynecology;  Laterality: N/A;  . LAPAROTOMY N/A 07/16/2015   Procedure: LAPAROTOMY, Myomectomy;  Surgeon: Waymon Amato, MD;  Location: [facility];  Service: Gynecology;  Laterality: N/A;  . RECTAL POLYPECTOMY      OB History    Gravida Para Term Preterm AB Living   0             SAB TAB Ectopic Multiple Live Births                   Home Medications    Prior to Admission medications   Medication Sig Start Date End Date Taking? Authorizing Provider  benzonatate (TESSALON) 100 MG capsule Take 2 capsules (200 mg total) by mouth 2 (two) times daily as needed for cough. 03/01/16   Nona Dell, PA-C  oxymetazoline (AFRIN NASAL SPRAY) 0.05 % nasal spray Place 1 spray into both nostrils 2 (two) times daily. Do not use for more than 3 days to prevent rebound rhinorrhea. 03/01/16   Nona Dell, PA-C  predniSONE (DELTASONE) 20 MG tablet Take 2 tablets (40 mg total) by mouth daily. 03/01/16   Nona Dell, PA-C  promethazine-dextromethorphan (PROMETHAZINE-DM) 6.25-15 MG/5ML syrup Take 5 mLs by mouth 4 (four) times daily as needed for cough. 02/27/16   Carlyle Dolly, MD    Family History Family History  Problem Relation Age of Onset  . Hypertension Father     Social History Social History  Substance Use Topics  . Smoking status: Current Every Day Smoker    Packs/day: 0.25    Years: 6.00    Types: Cigarettes  . Smokeless tobacco: Never Used  . Alcohol use Yes     Comment: occ    Allergies   Patient has no known allergies.  Review of [place] Review  of Systems  Gastrointestinal: Negative for abdominal pain, nausea and vomiting.  Genitourinary: Positive for vaginal discharge and vaginal pain. Negative for vaginal bleeding.  All other systems reviewed and are negative.  Physical Exam Updated Vital Signs BP 127/65 (BP Location: Left Arm)   Pulse 96   Temp 98.2 F (36.8 C) (Oral)   Resp 18   Ht 5\' 6"  (1.676 m)   Wt 235 lb (106.6 kg)   LMP 03/15/2016 (Approximate)   SpO2 99%   BMI 37.93 kg/m   Physical Exam  Constitutional: She is oriented to person, place, and time. She appears well-developed and well-nourished.  Obese  HENT:  Head: Normocephalic and atraumatic.  Mouth/Throat: Oropharynx is clear and moist.    Eyes: Conjunctivae and EOM are normal. Pupils are equal, round, and reactive to light.  Neck: Normal range of motion.  Cardiovascular: Normal rate, regular rhythm and normal heart sounds.   Pulmonary/Chest: Effort normal and breath sounds normal.  Abdominal: Soft. Bowel sounds are normal.  Genitourinary:  Genitourinary Comments: Normal female external genitalia without visible lesions or rashes; mild amount of milky, white vaginal discharge noted in vaginal vault; cervical os closed; no bleeding; no adnexal or CMT  Musculoskeletal: Normal range of motion.  Neurological: She is alert and oriented to person, place, and time.  Skin: Skin is warm and dry.  Psychiatric: She has a normal mood and affect.  Nursing note and vitals reviewed.  ED Treatments / Results  DIAGNOSTIC STUDIES: Oxygen Saturation is 99% on RA, normal by my interpretation.    COORDINATION OF CARE: 4:05 PM Discussed treatment plan with pt at bedside which includes Urinalysis and pt agreed to plan. Discussed option of performing tests for HIV and Syphilis; pt requested time to review options.  Labs (all labs ordered are listed, but only abnormal results are displayed) Labs Reviewed  WET PREP, GENITAL - Abnormal; Notable for the following:       Result Value   Clue Cells Wet Prep HPF POC PRESENT (*)    WBC, Wet Prep HPF POC MANY (*)    All other components within normal limits  URINALYSIS, ROUTINE W REFLEX MICROSCOPIC - Abnormal; Notable for the following:    APPearance CLOUDY (*)    All other components within normal limits  PREGNANCY, URINE  HIV ANTIBODY (ROUTINE TESTING)  RPR  GC/CHLAMYDIA PROBE AMP ([place]) NOT AT [facility]    EKG  EKG Interpretation None       Radiology No results found.  Procedures Procedures (including critical care time)  Medications Ordered in ED Medications - No data to display   Initial Impression / Assessment and Plan / ED Course  I have reviewed the triage vital signs  and the nursing notes.  Pertinent labs & imaging results that were available during my care of the patient were reviewed by me and considered in my medical decision making (see chart for details).  Clinical Course    25 year old female here with vaginal irritation and discharge. She notices 2 days ago. She is afebrile and nontoxic. Her abdominal exam is benign.  Pelvic exam performed, mild amount of milky, white vaginal discharge noted.  No bleeding or abnormal lesions. No cervical motion tenderness. UA without signs of infection.  Pregnancy test is negative. Wet prep with clue cells noted, many WBC's.  Gc/chl, HIV, RPR pending.  Will treat with flagyl for BV-- advised to avoid EtOH while taking this.  Patient advised remainder of culture showed result in the next 48-72  hours, she'll be contacted if they are abnormal. I've recommended that she follow-up with women's clinic or other OB/GYN for any ongoing issues.  Discussed plan with patient, she acknowledged understanding and agreed with plan of care.  Return precautions given for new or worsening symptoms.  Final Clinical Impressions(s) / ED Diagnoses   Final diagnoses:  Bacterial vaginosis  Vaginal irritation    New Prescriptions New Prescriptions   METRONIDAZOLE (FLAGYL) 500 MG TABLET    Take 1 tablet (500 mg total) by mouth 2 (two) times daily.   I personally performed the services described in this documentation, which was scribed in my presence. The recorded information has been reviewed and is accurate.   Larene Pickett, PA-C 04/10/16 [place], MD 04/11/16 [phone]

## 2016-04-10 NOTE — Discharge Instructions (Signed)
Your results today showed BV.  You will be notified in 48-72 if the rest of your results are positive or require treatment. Take the prescribed medication as directed.  Do not drink alcohol while taking this medication, it will make you sick. Follow-up with the women's clinic for any ongoing symptoms. Return to the ED for new or worsening symptoms.

## 2016-04-10 NOTE — ED Triage Notes (Signed)
Pt states "tingling"/burnuing sensation in vaginal canal since having intercourse with boyfriend 2-3 days ago.

## 2016-04-12 LAB — GC/CHLAMYDIA PROBE AMP (~~LOC~~) NOT AT ARMC
CHLAMYDIA, DNA PROBE: NEGATIVE
NEISSERIA GONORRHEA: NEGATIVE

## 2016-04-12 LAB — RPR: RPR Ser Ql: NONREACTIVE

## 2016-04-12 LAB — HIV ANTIBODY (ROUTINE TESTING W REFLEX): HIV SCREEN 4TH GENERATION: NONREACTIVE

## 2017-03-15 ENCOUNTER — Other Ambulatory Visit: Payer: Self-pay

## 2017-03-15 ENCOUNTER — Encounter (HOSPITAL_BASED_OUTPATIENT_CLINIC_OR_DEPARTMENT_OTHER): Payer: Self-pay | Admitting: *Deleted

## 2017-03-15 ENCOUNTER — Emergency Department (HOSPITAL_BASED_OUTPATIENT_CLINIC_OR_DEPARTMENT_OTHER)
Admission: EM | Admit: 2017-03-15 | Discharge: 2017-03-16 | Disposition: A | Discharge status: Home / Self Care | Payer: BLUE CROSS/BLUE SHIELD | Attending: Emergency Medicine | Admitting: Emergency Medicine

## 2017-03-15 DIAGNOSIS — D259 Leiomyoma of uterus, unspecified: Secondary | ICD-10-CM | POA: Diagnosis not present

## 2017-03-15 DIAGNOSIS — N8311 Corpus luteum cyst of right ovary: Secondary | ICD-10-CM | POA: Insufficient documentation

## 2017-03-15 DIAGNOSIS — Z79899 Other long term (current) drug therapy: Secondary | ICD-10-CM | POA: Diagnosis not present

## 2017-03-15 DIAGNOSIS — R102 Pelvic and perineal pain: Secondary | ICD-10-CM | POA: Diagnosis present

## 2017-03-15 DIAGNOSIS — F17210 Nicotine dependence, cigarettes, uncomplicated: Secondary | ICD-10-CM | POA: Diagnosis not present

## 2017-03-15 DIAGNOSIS — N760 Acute vaginitis: Secondary | ICD-10-CM | POA: Diagnosis not present

## 2017-03-15 DIAGNOSIS — B9689 Other specified bacterial agents as the cause of diseases classified elsewhere: Secondary | ICD-10-CM | POA: Insufficient documentation

## 2017-03-15 LAB — URINALYSIS, ROUTINE W REFLEX MICROSCOPIC
Bilirubin Urine: NEGATIVE
GLUCOSE, UA: NEGATIVE mg/dL
KETONES UR: NEGATIVE mg/dL
Leukocytes, UA: NEGATIVE
Nitrite: NEGATIVE
PROTEIN: NEGATIVE mg/dL
Specific Gravity, Urine: >1.03 — ABNORMAL HIGH (ref 1.005–1.030)
pH: 6 (ref 5.0–8.0)

## 2017-03-15 LAB — URINALYSIS, MICROSCOPIC (REFLEX)

## 2017-03-15 LAB — PREGNANCY, URINE: PREG TEST UR: NEGATIVE

## 2017-03-15 NOTE — ED Triage Notes (Signed)
Generalized abdominal pain x 2 days. Vaginal pain and itching. Denies discharge. Vaginal odor.

## 2017-03-16 ENCOUNTER — Encounter (HOSPITAL_BASED_OUTPATIENT_CLINIC_OR_DEPARTMENT_OTHER): Payer: Self-pay

## 2017-03-16 ENCOUNTER — Ambulatory Visit (HOSPITAL_BASED_OUTPATIENT_CLINIC_OR_DEPARTMENT_OTHER)
Admission: RE | Admit: 2017-03-16 | Discharge: 2017-03-16 | Disposition: A | Discharge status: Home / Self Care | Payer: BLUE CROSS/BLUE SHIELD | Source: Ambulatory Visit | Attending: Emergency Medicine | Admitting: Emergency Medicine

## 2017-03-16 DIAGNOSIS — R102 Pelvic and perineal pain: Secondary | ICD-10-CM

## 2017-03-16 DIAGNOSIS — D259 Leiomyoma of uterus, unspecified: Secondary | ICD-10-CM | POA: Insufficient documentation

## 2017-03-16 DIAGNOSIS — N8311 Corpus luteum cyst of right ovary: Secondary | ICD-10-CM

## 2017-03-16 LAB — WET PREP, GENITAL
Sperm: NONE SEEN
TRICH WET PREP: NONE SEEN
Yeast Wet Prep HPF POC: NONE SEEN

## 2017-03-16 LAB — GC/CHLAMYDIA PROBE AMP (~~LOC~~) NOT AT ARMC
CHLAMYDIA, DNA PROBE: NEGATIVE
NEISSERIA GONORRHEA: NEGATIVE

## 2017-03-16 MED ORDER — FLUCONAZOLE 100 MG PO TABS
150.0000 mg | ORAL_TABLET | Freq: Once | ORAL | Status: AC
  Administered 2017-03-16: 150 mg via ORAL
  Filled 2017-03-16: qty 1

## 2017-03-16 MED ORDER — METRONIDAZOLE 500 MG PO TABS: 500.0000 mg | ORAL_TABLET | Freq: Two times a day (BID) | ORAL | 0 refills | Status: AC

## 2017-03-16 NOTE — ED Provider Notes (Signed)
[facility] Provider Note: Laura Spurling, MD, FACEP  CSN: [id number] MRN: [id number] ARRIVAL: 03/15/17 at 2257 ROOM: [place]  Abdominal Pain and Vaginal Pain   HISTORY OF PRESENT ILLNESS  03/16/17 2:39 AM Laura Vargas is a 26 y.o. female with a 2-day history of lower abdominal pain.  The pain is vaguely described but is rated as a 6 out of 10.  It is worse with movement or palpation.  The pain has moved down into her vagina as well.  She has noticed associated itching of her vagina with an abnormal odor.  She has not noticed vaginal discharge.  The itching is similar to previous yeast infections.  She denies fever, chills, nausea, vomiting, diarrhea, dysuria or hematuria.  He does have a history of uterine fibroids, polycystic ovarian syndrome and endometriosis.    Past Medical History:  Diagnosis Date  . Breast cyst   . Bronchitis   . Dysmenorrhea   . Fibroids   . Ovarian cyst   . Pyloric ulcer   . Shingles     Past Surgical History:  Procedure Laterality Date  . BREAST LUMPECTOMY    . LAPAROSCOPY N/A 07/16/2015   Procedure: LAPAROSCOPY OPERATIVE, Chromopertubation;  Surgeon: Waymon Amato, MD;  Location: [facility];  Service: Gynecology;  Laterality: N/A;  . LAPAROTOMY N/A 07/16/2015   Procedure: LAPAROTOMY, Myomectomy;  Surgeon: Waymon Amato, MD;  Location: [facility];  Service: Gynecology;  Laterality: N/A;  . RECTAL POLYPECTOMY      Family History  Problem Relation Age of Onset  . Hypertension Father     Social History   Tobacco Use  . Smoking status: Current Every Day Smoker    Packs/day: 0.25    Years: 6.00    Pack years: 1.50    Types: Cigarettes  . Smokeless tobacco: Never Used  Substance Use Topics  . Alcohol use: Yes    Comment: occ  . Drug use: Yes    Types: Marijuana    Comment: some days    Prior to Admission medications   Medication Sig Start Date End Date Taking? Authorizing Provider  benzonatate (TESSALON) 100 MG  capsule Take 2 capsules (200 mg total) by mouth 2 (two) times daily as needed for cough. 03/01/16   Nona Dell, PA-C  metroNIDAZOLE (FLAGYL) 500 MG tablet Take 1 tablet (500 mg total) by mouth 2 (two) times daily. 04/10/16   Larene Pickett, PA-C  oxymetazoline (AFRIN NASAL SPRAY) 0.05 % nasal spray Place 1 spray into both nostrils 2 (two) times daily. Do not use for more than 3 days to prevent rebound rhinorrhea. 03/01/16   Nona Dell, PA-C  predniSONE (DELTASONE) 20 MG tablet Take 2 tablets (40 mg total) by mouth daily. 03/01/16   Nona Dell, PA-C  promethazine-dextromethorphan (PROMETHAZINE-DM) 6.25-15 MG/5ML syrup Take 5 mLs by mouth 4 (four) times daily as needed for cough. 02/27/16   Carlyle Dolly, MD    Allergies Patient has no known allergies.   REVIEW OF SYSTEMS  Negative except as noted here or in the History of Present Illness.   PHYSICAL EXAMINATION  Initial Vital Signs Blood pressure 117/70, pulse 83, temperature 98.3 F (36.8 C), temperature source Oral, resp. rate 18, height 5\' 6"  (1.676 m), weight 108.9 kg (240 lb), last menstrual period 03/07/2017, SpO2 100 %.  Examination General: Well-developed, well-nourished female in no acute distress; appearance consistent with age of record HENT: normocephalic; atraumatic Eyes: pupils equal, round and  reactive to light; extraocular muscles intact Neck: supple Heart: regular rate and rhythm Lungs: clear to auscultation bilaterally Abdomen: soft; nondistended; lower abdominal tenderness more prominent on the right; no masses or hepatosplenomegaly; bowel sounds present GU: Normal external genitalia; physiologic appearing vaginal discharge; no vaginal bleeding; no cervical motion tenderness; bilateral adnexal tenderness that is not severe Extremities: No deformity; full range of motion; pulses normal Neurologic: Awake, alert and oriented; motor function intact in all extremities and  symmetric; no facial droop Skin: Warm and dry Psychiatric: Normal mood and affect   RESULTS  Summary of this visit's results, reviewed by myself:   EKG Interpretation  Date/Time:    Ventricular Rate:    PR Interval:    QRS Duration:   QT Interval:    QTC Calculation:   R Axis:     Text Interpretation:        Laboratory Studies: Results for orders placed or performed during the hospital encounter of 03/15/17 (from the past 24 hour(s))  Pregnancy, urine     Status: None   Collection Time: 03/15/17 11:10 PM  Result Value Ref Range   Preg Test, Ur NEGATIVE NEGATIVE  Urinalysis, Routine w reflex microscopic     Status: Abnormal   Collection Time: 03/15/17 11:10 PM  Result Value Ref Range   Color, Urine YELLOW YELLOW   APPearance HAZY (A) CLEAR   Specific Gravity, Urine >1.030 (H) 1.005 - 1.030   pH 6.0 5.0 - 8.0   Glucose, UA NEGATIVE NEGATIVE mg/dL   Hgb urine dipstick TRACE (A) NEGATIVE   Bilirubin Urine NEGATIVE NEGATIVE   Ketones, ur NEGATIVE NEGATIVE mg/dL   Protein, ur NEGATIVE NEGATIVE mg/dL   Nitrite NEGATIVE NEGATIVE   Leukocytes, UA NEGATIVE NEGATIVE  Urinalysis, Microscopic (reflex)     Status: Abnormal   Collection Time: 03/15/17 11:10 PM  Result Value Ref Range   RBC / HPF 0-5 0 - 5 RBC/hpf   WBC, UA 0-5 0 - 5 WBC/hpf   Bacteria, UA FEW (A) NONE SEEN   Squamous Epithelial / LPF 6-30 (A) NONE SEEN   Mucus PRESENT   Wet prep, genital     Status: Abnormal   Collection Time: 03/16/17  2:41 AM  Result Value Ref Range   Yeast Wet Prep HPF POC NONE SEEN NONE SEEN   Trich, Wet Prep NONE SEEN NONE SEEN   Clue Cells Wet Prep HPF POC PRESENT (A) NONE SEEN   WBC, Wet Prep HPF POC MODERATE (A) NONE SEEN   Sperm NONE SEEN    Imaging Studies: No results found.  ED COURSE  Nursing notes and initial vitals signs, including pulse oximetry, reviewed.  Vitals:   03/15/17 2301 03/15/17 2303 03/16/17 0223  BP:  127/67 117/70  Pulse:  93 83  Resp:  18 18  Temp:   98.3 F (36.8 C)   TempSrc:  Oral   SpO2:  99% 100%  Weight: 108.9 kg (240 lb)    Height: 5\' 6"  (1.676 m)     We will treat for bacterial vaginosis and have the patient return for ultrasound later today.  PROCEDURES    ED DIAGNOSES     ICD-10-CM   1. Pelvic pain in female R10.2   2. BV (bacterial vaginosis) N76.0    B96.89        Naria Abbey, MD 03/16/17 0301

## 2017-04-11 ENCOUNTER — Other Ambulatory Visit (HOSPITAL_COMMUNITY): Payer: Self-pay | Admitting: Obstetrics & Gynecology

## 2017-04-11 DIAGNOSIS — N979 Female infertility, unspecified: Secondary | ICD-10-CM

## 2017-05-09 ENCOUNTER — Ambulatory Visit (HOSPITAL_COMMUNITY): Payer: BLUE CROSS/BLUE SHIELD

## 2017-05-09 ENCOUNTER — Encounter (HOSPITAL_COMMUNITY): Payer: Self-pay

## 2017-11-28 ENCOUNTER — Encounter (HOSPITAL_BASED_OUTPATIENT_CLINIC_OR_DEPARTMENT_OTHER): Payer: Self-pay

## 2017-11-28 ENCOUNTER — Emergency Department (HOSPITAL_BASED_OUTPATIENT_CLINIC_OR_DEPARTMENT_OTHER)
Admission: EM | Admit: 2017-11-28 | Discharge: 2017-11-28 | Disposition: A | Discharge status: Home / Self Care | Payer: Self-pay | Attending: Emergency Medicine | Admitting: Emergency Medicine

## 2017-11-28 ENCOUNTER — Other Ambulatory Visit: Payer: Self-pay

## 2017-11-28 ENCOUNTER — Emergency Department (HOSPITAL_BASED_OUTPATIENT_CLINIC_OR_DEPARTMENT_OTHER): Payer: Self-pay

## 2017-11-28 DIAGNOSIS — Z8742 Personal history of other diseases of the female genital tract: Secondary | ICD-10-CM | POA: Insufficient documentation

## 2017-11-28 DIAGNOSIS — F17210 Nicotine dependence, cigarettes, uncomplicated: Secondary | ICD-10-CM | POA: Insufficient documentation

## 2017-11-28 DIAGNOSIS — R102 Pelvic and perineal pain: Secondary | ICD-10-CM | POA: Insufficient documentation

## 2017-11-28 LAB — CBC WITH DIFFERENTIAL/PLATELET
BASOS ABS: 0 10*3/uL (ref 0.0–0.1)
BASOS PCT: 0 %
Eosinophils Absolute: 0.1 10*3/uL (ref 0.0–0.7)
Eosinophils Relative: 2 %
HEMATOCRIT: 34.8 % — AB (ref 36.0–46.0)
HEMOGLOBIN: 11.4 g/dL — AB (ref 12.0–15.0)
LYMPHS PCT: 31 %
Lymphs Abs: 2.7 10*3/uL (ref 0.7–4.0)
MCH: 28.5 pg (ref 26.0–34.0)
MCHC: 32.8 g/dL (ref 30.0–36.0)
MCV: 87 fL (ref 78.0–100.0)
MONO ABS: 0.5 10*3/uL (ref 0.1–1.0)
MONOS PCT: 5 %
NEUTROS ABS: 5.6 10*3/uL (ref 1.7–7.7)
NEUTROS PCT: 62 %
Platelets: 254 10*3/uL (ref 150–400)
RBC: 4 MIL/uL (ref 3.87–5.11)
RDW: 13.1 % (ref 11.5–15.5)
WBC: 9 10*3/uL (ref 4.0–10.5)

## 2017-11-28 LAB — COMPREHENSIVE METABOLIC PANEL
ALBUMIN: 3.8 g/dL (ref 3.5–5.0)
ALT: 13 U/L (ref 0–44)
ANION GAP: 9 (ref 5–15)
AST: 13 U/L — AB (ref 15–41)
Alkaline Phosphatase: 55 U/L (ref 38–126)
BUN: 11 mg/dL (ref 6–20)
CO2: 25 mmol/L (ref 22–32)
Calcium: 8.9 mg/dL (ref 8.9–10.3)
Chloride: 104 mmol/L (ref 98–111)
Creatinine, Ser: 0.78 mg/dL (ref 0.44–1.00)
GFR calc Af Amer: >60 mL/min (ref >60)
GFR calc non Af Amer: >60 mL/min (ref >60)
GLUCOSE: 84 mg/dL (ref 70–99)
POTASSIUM: 3.7 mmol/L (ref 3.5–5.1)
Sodium: 138 mmol/L (ref 135–145)
TOTAL PROTEIN: 7 g/dL (ref 6.5–8.1)
Total Bilirubin: 0.3 mg/dL (ref 0.3–1.2)

## 2017-11-28 LAB — WET PREP, GENITAL
Sperm: NONE SEEN
TRICH WET PREP: NONE SEEN
Yeast Wet Prep HPF POC: NONE SEEN

## 2017-11-28 LAB — URINALYSIS, ROUTINE W REFLEX MICROSCOPIC
Bilirubin Urine: NEGATIVE
GLUCOSE, UA: NEGATIVE mg/dL
Ketones, ur: NEGATIVE mg/dL
LEUKOCYTES UA: NEGATIVE
NITRITE: NEGATIVE
PH: 6.5 (ref 5.0–8.0)
Protein, ur: NEGATIVE mg/dL
SPECIFIC GRAVITY, URINE: 1.02 (ref 1.005–1.030)

## 2017-11-28 LAB — URINALYSIS, MICROSCOPIC (REFLEX)

## 2017-11-28 LAB — PREGNANCY, URINE: Preg Test, Ur: NEGATIVE

## 2017-11-28 LAB — LIPASE, BLOOD: LIPASE: 31 U/L (ref 11–51)

## 2017-11-28 MED ORDER — SODIUM CHLORIDE 0.9 % IV BOLUS
1000.0000 mL | Freq: Once | INTRAVENOUS | Status: AC
  Administered 2017-11-28: 1000 mL via INTRAVENOUS

## 2017-11-28 MED ORDER — KETOROLAC TROMETHAMINE 15 MG/ML IJ SOLN
15.0000 mg | Freq: Once | INTRAMUSCULAR | Status: AC
  Administered 2017-11-28: 15 mg via INTRAVENOUS
  Filled 2017-11-28: qty 1

## 2017-11-28 MED ORDER — HYDROMORPHONE HCL 1 MG/ML IJ SOLN: 1.0000 mg | Freq: Once | INTRAMUSCULAR | Status: DC

## 2017-11-28 MED ORDER — ACETAMINOPHEN 500 MG PO TABS
1000.0000 mg | ORAL_TABLET | Freq: Once | ORAL | Status: AC
  Administered 2017-11-28: 1000 mg via ORAL
  Filled 2017-11-28: qty 2

## 2017-11-28 NOTE — ED Provider Notes (Signed)
[facility] EMERGENCY DEPARTMENT Provider Note   CSN: [id number] Arrival date & time: 11/28/17  1511     History   Chief Complaint Chief Complaint  Patient presents with  . Pelvic Pain    HPI Laura Vargas is a 27 y.o. female.  HPI  27 year old female with a history of ovarian cyst and uterine fibroids presents with lower abdominal pain.  The patient states that she is been having worsening, constant pain for about 1 week.  Pain feels like a twisting sensation.  It is across her entire lower abdomen and also now on her low back.  No urinary symptoms or vaginal bleeding.  LMP about 3 weeks ago.  Had surgery to remove fibroids a couple years ago and this feels somewhat similar.  Has been taking Aleve multiple times without relief.  Pain is rated as an 8/10.  She is also had vomiting in the morning for the past 2 days.  She took a pregnancy test this morning that was positive.  However she states this also occurred when she was not pregnant and had the fibroids a few years ago. Pain has been gradually worsening.  Past Medical History:  Diagnosis Date  . Breast cyst   . Bronchitis   . Dysmenorrhea   . Fibroids   . Ovarian cyst   . Pyloric ulcer   . Shingles     Patient Active Problem List   Diagnosis Date Noted  . Status post myomectomy 07/16/2015  . Acute pelvic pain, female 05/29/2015  . Fibroids 05/29/2015  . Irregular menstrual cycle 03/20/2012  . Dysmenorrhea 03/20/2012    Past Surgical History:  Procedure Laterality Date  . BREAST LUMPECTOMY Left    benign per patient  . LAPAROSCOPY N/A 07/16/2015   Procedure: LAPAROSCOPY OPERATIVE, Chromopertubation;  Surgeon: Waymon Amato, MD;  Location: [facility];  Service: Gynecology;  Laterality: N/A;  . LAPAROTOMY N/A 07/16/2015   Procedure: LAPAROTOMY, Myomectomy;  Surgeon: Waymon Amato, MD;  Location: [facility];  Service: Gynecology;  Laterality: N/A;  . RECTAL POLYPECTOMY       OB History    Gravida  0   Para      Term      Preterm      AB      Living        SAB      TAB      Ectopic      Multiple      Live Births               Home Medications    Prior to Admission medications   Medication Sig Start Date End Date Taking? Authorizing Provider  metroNIDAZOLE (FLAGYL) 500 MG tablet Take 1 tablet (500 mg total) by mouth 2 (two) times daily. One po bid x 7 days 03/16/17   Molpus, John, MD    Family History Family History  Problem Relation Age of Onset  . Hypertension Father     Social History Social History   Tobacco Use  . Smoking status: Current Every Day Smoker    Packs/day: 0.25    Years: 6.00    Pack years: 1.50    Types: Cigarettes  . Smokeless tobacco: Never Used  Substance Use Topics  . Alcohol use: Yes    Comment: occ  . Drug use: Yes    Types: Marijuana     Allergies   Patient has no known allergies.   Review of Systems Review of Systems  Constitutional: Negative for fever.  Gastrointestinal: Positive for abdominal pain and vomiting.  Genitourinary: Negative for dysuria, hematuria, menstrual problem, vaginal bleeding and vaginal discharge.  Musculoskeletal: Positive for back pain.  All other systems reviewed and are negative.    Physical Exam Updated Vital Signs BP 103/65 (BP Location: Left Arm)   Pulse 84   Temp 98.2 F (36.8 C) (Oral)   Resp 18   Ht 5\' 6"  (1.676 m)   Wt 120.7 kg (266 lb)   LMP 11/07/2017 (Approximate)   SpO2 99%   BMI 42.93 kg/m   Physical Exam  Constitutional: She is oriented to person, place, and time. She appears well-developed and well-nourished.  obese  HENT:  Head: Normocephalic and atraumatic.  Right Ear: External ear normal.  Left Ear: External ear normal.  Nose: Nose normal.  Eyes: Right eye exhibits no discharge. Left eye exhibits no discharge.  Cardiovascular: Normal rate, regular rhythm and normal heart sounds.  Pulmonary/Chest: Effort normal and breath sounds normal.  Abdominal: Soft. There is  tenderness in the right lower quadrant, suprapubic area and left lower quadrant. There is no CVA tenderness.  Genitourinary: Vaginal discharge (minimal) found.  Genitourinary Comments: No bleeding, minimal discharge on pelvic exam. Limited due to obesity, unable to palpate important structures. However she was very uncomfortable and states this was reproducing her pain.  Musculoskeletal:       Lumbar back: She exhibits tenderness (mild, diffuse).  Neurological: She is alert and oriented to person, place, and time.  Skin: Skin is warm and dry.  Nursing note and vitals reviewed.    ED Treatments / Results  Labs (all labs ordered are listed, but only abnormal results are displayed) Labs Reviewed  WET PREP, GENITAL - Abnormal; Notable for the following components:      Result Value   Clue Cells Wet Prep HPF POC PRESENT (*)    WBC, Wet Prep HPF POC FEW (*)    All other components within normal limits  URINALYSIS, ROUTINE W REFLEX MICROSCOPIC - Abnormal; Notable for the following components:   Hgb urine dipstick TRACE (*)    All other components within normal limits  URINALYSIS, MICROSCOPIC (REFLEX) - Abnormal; Notable for the following components:   Bacteria, UA RARE (*)    All other components within normal limits  COMPREHENSIVE METABOLIC PANEL - Abnormal; Notable for the following components:   AST 13 (*)    All other components within normal limits  CBC WITH DIFFERENTIAL/PLATELET - Abnormal; Notable for the following components:   Hemoglobin 11.4 (*)    HCT 34.8 (*)    All other components within normal limits  PREGNANCY, URINE  LIPASE, BLOOD  GC/CHLAMYDIA PROBE AMP ([place]) NOT AT [facility]    EKG None  Radiology US Transvaginal Non-ob  Result Date: 11/28/2017 CLINICAL DATA:  Pelvic pain. EXAM: TRANSABDOMINAL AND TRANSVAGINAL ULTRASOUND OF PELVIS DOPPLER ULTRASOUND OF OVARIES TECHNIQUE: Both transabdominal and transvaginal ultrasound examinations of the pelvis were  performed. Transabdominal technique was performed for global imaging of the pelvis including uterus, ovaries, adnexal regions, and pelvic cul-de-sac. It was necessary to proceed with endovaginal exam following the transabdominal exam to visualize the endometrium and ovaries. Color and duplex Doppler ultrasound was utilized to evaluate blood flow to the ovaries. COMPARISON:  May 29, 2015 FINDINGS: Uterus Measurements: 7.1 x 4.5 x 5.8 cm. No fibroids or other mass visualized. Endometrium Thickness: 17 mm.  No focal abnormality visualized. Right ovary Measurements: 4.3 x 2.6 x 2.1 cm.  Multiple peripheral  follicles. Left ovary Measurements: 4.2 x 2.4 x 3.9 cm. Contains a corpus luteum cyst. Multiple peripheral follicles. Pulsed Doppler evaluation of both ovaries demonstrates normal low-resistance arterial and venous waveforms. Other findings There is a small amount of physiologic fluid in the pelvis. IMPRESSION: 1. Multiple small peripheral follicles are seen in both ovaries which could be seen with polycystic ovarian syndrome. Recommend clinical correlation. There is a corpus luteum cyst in the left ovary. Arterial and venous blood flow documented in both ovaries. 2. No other abnormalities. Electronically Signed   By: Dorise Bullion III M.D   On: 11/28/2017 20:17   US Pelvis Limited (transabdominal Only)  Result Date: 11/28/2017 CLINICAL DATA:  Pelvic pain. EXAM: TRANSABDOMINAL AND TRANSVAGINAL ULTRASOUND OF PELVIS DOPPLER ULTRASOUND OF OVARIES TECHNIQUE: Both transabdominal and transvaginal ultrasound examinations of the pelvis were performed. Transabdominal technique was performed for global imaging of the pelvis including uterus, ovaries, adnexal regions, and pelvic cul-de-sac. It was necessary to proceed with endovaginal exam following the transabdominal exam to visualize the endometrium and ovaries. Color and duplex Doppler ultrasound was utilized to evaluate blood flow to the ovaries. COMPARISON:   May 29, 2015 FINDINGS: Uterus Measurements: 7.1 x 4.5 x 5.8 cm. No fibroids or other mass visualized. Endometrium Thickness: 17 mm.  No focal abnormality visualized. Right ovary Measurements: 4.3 x 2.6 x 2.1 cm.  Multiple peripheral follicles. Left ovary Measurements: 4.2 x 2.4 x 3.9 cm. Contains a corpus luteum cyst. Multiple peripheral follicles. Pulsed Doppler evaluation of both ovaries demonstrates normal low-resistance arterial and venous waveforms. Other findings There is a small amount of physiologic fluid in the pelvis. IMPRESSION: 1. Multiple small peripheral follicles are seen in both ovaries which could be seen with polycystic ovarian syndrome. Recommend clinical correlation. There is a corpus luteum cyst in the left ovary. Arterial and venous blood flow documented in both ovaries. 2. No other abnormalities. Electronically Signed   By: Dorise Bullion III M.D   On: 11/28/2017 20:17   [place] Art/ven Flow Abd Pelv Doppler  Result Date: 11/28/2017 CLINICAL DATA:  Pelvic pain. EXAM: TRANSABDOMINAL AND TRANSVAGINAL ULTRASOUND OF PELVIS DOPPLER ULTRASOUND OF OVARIES TECHNIQUE: Both transabdominal and transvaginal ultrasound examinations of the pelvis were performed. Transabdominal technique was performed for global imaging of the pelvis including uterus, ovaries, adnexal regions, and pelvic cul-de-sac. It was necessary to proceed with endovaginal exam following the transabdominal exam to visualize the endometrium and ovaries. Color and duplex Doppler ultrasound was utilized to evaluate blood flow to the ovaries. COMPARISON:  May 29, 2015 FINDINGS: Uterus Measurements: 7.1 x 4.5 x 5.8 cm. No fibroids or other mass visualized. Endometrium Thickness: 17 mm.  No focal abnormality visualized. Right ovary Measurements: 4.3 x 2.6 x 2.1 cm.  Multiple peripheral follicles. Left ovary Measurements: 4.2 x 2.4 x 3.9 cm. Contains a corpus luteum cyst. Multiple peripheral follicles. Pulsed Doppler evaluation of both  ovaries demonstrates normal low-resistance arterial and venous waveforms. Other findings There is a small amount of physiologic fluid in the pelvis. IMPRESSION: 1. Multiple small peripheral follicles are seen in both ovaries which could be seen with polycystic ovarian syndrome. Recommend clinical correlation. There is a corpus luteum cyst in the left ovary. Arterial and venous blood flow documented in both ovaries. 2. No other abnormalities. Electronically Signed   By: Dorise Bullion III M.D   On: 11/28/2017 20:17    Procedures Procedures (including critical care time)  Medications Ordered in ED Medications  sodium chloride 0.9 % bolus 1,000 mL (0 mLs  Intravenous Stopped 11/28/17 1806)  ketorolac (TORADOL) 15 MG/ML injection 15 mg (15 mg Intravenous Given 11/28/17 1727)  acetaminophen (TYLENOL) tablet 1,000 mg (1,000 mg Oral Given 11/28/17 1726)     Initial Impression / Assessment and Plan / ED Course  I have reviewed the triage vital signs and the nursing notes.  Pertinent labs & imaging results that were available during my care of the patient were reviewed by me and considered in my medical decision making (see chart for details).     Patient states she does have PCOS which is shown on the ultrasound.  Her pain does seem to be pelvic but she is reporting some improvement with the Toradol and Tylenol.  Thus I will recommend continued NSAIDs at home.  She does have some clue cells but she is often had this and does not have significant discharge or infectious type symptoms.  At this point, I do not think a CT is warranted and think she can follow-up with her OB/GYN.  Return precautions.  Final Clinical Impressions(s) / ED Diagnoses   Final diagnoses:  Pelvic pain in female    ED Discharge Orders    None       Sherwood Gambler, MD 11/29/17 0002

## 2017-11-28 NOTE — ED Notes (Signed)
Updated pt to [place] delay, pt ambulatory to restroom without difficulty

## 2017-11-28 NOTE — ED Notes (Signed)
Updated pt to wait time

## 2017-11-28 NOTE — ED Triage Notes (Signed)
C/o pelvic pain x 1 week-states she had pos pregnancy test this am-NAD-steady gait

## 2017-11-28 NOTE — Discharge Instructions (Signed)
If your abdominal pain/pelvic pain worsens, you develop vomiting, fever, other new/concerning symptoms and return to the ER for evaluation.

## 2017-11-28 NOTE — ED Notes (Signed)
Pt c/o pelvic pain for a week with clear vaginal discharge, unrelieved with Aleve

## 2017-11-29 LAB — GC/CHLAMYDIA PROBE AMP (~~LOC~~) NOT AT ARMC
CHLAMYDIA, DNA PROBE: NEGATIVE
NEISSERIA GONORRHEA: NEGATIVE

## 2017-12-30 ENCOUNTER — Emergency Department (HOSPITAL_BASED_OUTPATIENT_CLINIC_OR_DEPARTMENT_OTHER): Payer: Self-pay

## 2017-12-30 ENCOUNTER — Encounter (HOSPITAL_BASED_OUTPATIENT_CLINIC_OR_DEPARTMENT_OTHER): Payer: Self-pay | Admitting: *Deleted

## 2017-12-30 ENCOUNTER — Other Ambulatory Visit: Payer: Self-pay

## 2017-12-30 ENCOUNTER — Emergency Department (HOSPITAL_BASED_OUTPATIENT_CLINIC_OR_DEPARTMENT_OTHER)
Admission: EM | Admit: 2017-12-30 | Discharge: 2017-12-31 | Disposition: A | Discharge status: Home / Self Care | Payer: Self-pay | Attending: Emergency Medicine | Admitting: Emergency Medicine

## 2017-12-30 DIAGNOSIS — Z79899 Other long term (current) drug therapy: Secondary | ICD-10-CM | POA: Insufficient documentation

## 2017-12-30 DIAGNOSIS — R102 Pelvic and perineal pain: Secondary | ICD-10-CM | POA: Insufficient documentation

## 2017-12-30 DIAGNOSIS — F17210 Nicotine dependence, cigarettes, uncomplicated: Secondary | ICD-10-CM | POA: Insufficient documentation

## 2017-12-30 LAB — CBC WITH DIFFERENTIAL/PLATELET
BASOS PCT: 0 %
Basophils Absolute: 0 10*3/uL (ref 0.0–0.1)
Eosinophils Absolute: 0.2 10*3/uL (ref 0.0–0.7)
Eosinophils Relative: 1 %
HEMATOCRIT: 36.1 % (ref 36.0–46.0)
HEMOGLOBIN: 11.9 g/dL — AB (ref 12.0–15.0)
LYMPHS PCT: 36 %
Lymphs Abs: 3.9 10*3/uL (ref 0.7–4.0)
MCH: 28.5 pg (ref 26.0–34.0)
MCHC: 33 g/dL (ref 30.0–36.0)
MCV: 86.4 fL (ref 78.0–100.0)
Monocytes Absolute: 0.6 10*3/uL (ref 0.1–1.0)
Monocytes Relative: 6 %
NEUTROS PCT: 57 %
Neutro Abs: 6.2 10*3/uL (ref 1.7–7.7)
PLATELETS: 298 10*3/uL (ref 150–400)
RBC: 4.18 MIL/uL (ref 3.87–5.11)
RDW: 13.4 % (ref 11.5–15.5)
WBC: 10.8 10*3/uL — AB (ref 4.0–10.5)

## 2017-12-30 LAB — COMPREHENSIVE METABOLIC PANEL
ALBUMIN: 4 g/dL (ref 3.5–5.0)
ALT: 16 U/L (ref 0–44)
ANION GAP: 10 (ref 5–15)
AST: 16 U/L (ref 15–41)
Alkaline Phosphatase: 56 U/L (ref 38–126)
BILIRUBIN TOTAL: 0.4 mg/dL (ref 0.3–1.2)
BUN: 10 mg/dL (ref 6–20)
CO2: 24 mmol/L (ref 22–32)
Calcium: 9.2 mg/dL (ref 8.9–10.3)
Chloride: 103 mmol/L (ref 98–111)
Creatinine, Ser: 0.78 mg/dL (ref 0.44–1.00)
GFR calc Af Amer: >60 mL/min (ref >60)
GFR calc non Af Amer: >60 mL/min (ref >60)
GLUCOSE: 97 mg/dL (ref 70–99)
POTASSIUM: 3.9 mmol/L (ref 3.5–5.1)
SODIUM: 137 mmol/L (ref 135–145)
TOTAL PROTEIN: 7.6 g/dL (ref 6.5–8.1)

## 2017-12-30 LAB — URINALYSIS, ROUTINE W REFLEX MICROSCOPIC
Bilirubin Urine: NEGATIVE
GLUCOSE, UA: NEGATIVE mg/dL
Ketones, ur: NEGATIVE mg/dL
LEUKOCYTES UA: NEGATIVE
Nitrite: NEGATIVE
PH: 5.5 (ref 5.0–8.0)
PROTEIN: NEGATIVE mg/dL
Specific Gravity, Urine: >1.03 — ABNORMAL HIGH (ref 1.005–1.030)

## 2017-12-30 LAB — URINALYSIS, MICROSCOPIC (REFLEX)

## 2017-12-30 LAB — LIPASE, BLOOD: LIPASE: 33 U/L (ref 11–51)

## 2017-12-30 LAB — PREGNANCY, URINE: Preg Test, Ur: NEGATIVE

## 2017-12-30 MED ORDER — ONDANSETRON 4 MG PO TBDP
4.0000 mg | ORAL_TABLET | Freq: Once | ORAL | Status: AC
  Administered 2017-12-30: 4 mg via ORAL
  Filled 2017-12-30: qty 1

## 2017-12-30 MED ORDER — FENTANYL CITRATE (PF) 100 MCG/2ML IJ SOLN
100.0000 ug | Freq: Once | INTRAMUSCULAR | Status: AC
  Administered 2017-12-30: 100 ug via INTRAVENOUS
  Filled 2017-12-30: qty 2

## 2017-12-30 MED ORDER — OXYCODONE-ACETAMINOPHEN 5-325 MG PO TABS
2.0000 | ORAL_TABLET | Freq: Once | ORAL | Status: AC
  Administered 2017-12-30: 2 via ORAL
  Filled 2017-12-30: qty 2

## 2017-12-30 MED ORDER — DIPHENHYDRAMINE HCL 25 MG PO CAPS
25.0000 mg | ORAL_CAPSULE | Freq: Once | ORAL | Status: AC
  Administered 2017-12-30: 25 mg via ORAL
  Filled 2017-12-30: qty 1

## 2017-12-30 MED ORDER — ACETAMINOPHEN 325 MG PO TABS
650.0000 mg | ORAL_TABLET | Freq: Once | ORAL | Status: AC
  Administered 2017-12-30: 650 mg via ORAL
  Filled 2017-12-30: qty 2

## 2017-12-30 MED ORDER — ONDANSETRON HCL 4 MG/2ML IJ SOLN
4.0000 mg | Freq: Once | INTRAMUSCULAR | Status: AC
  Administered 2017-12-30: 4 mg via INTRAVENOUS
  Filled 2017-12-30: qty 2

## 2017-12-30 NOTE — ED Triage Notes (Signed)
Vaginal pain. States the last time she had this pain she was diagnosed with fibroid tumors. She is crying. LMP was 2 weeks ago.

## 2017-12-30 NOTE — ED Provider Notes (Signed)
[facility] Provider Note   CSN: [id number] Arrival date & time: 12/30/17  1940     History   Chief Complaint Chief Complaint  Patient presents with  . Vaginal Pain    HPI Laura Vargas is a 27 y.o. female.  Who presents emergency department with chief complaint of pelvic pain.  Patient has a past medical history of endometriosis, fibroid uterus, pelvic pain.  She complains of pain all across her pelvic region that is severe and constant and has been present for 2 weeks.  Patient states that she has been trying to tough it out at home but could not take it anymore and so presented to the emergency department.  She denies vaginal symptoms, urinary symptoms.  The pain is a little bit worse on the left side.  Patient states that her pain began when she ended menstruation 1 week ago which is not consistent with her usual pattern of pain with her endometriosis.  She did not follow with her OB/GYN.  She denies nausea, vomiting, diarrhea, constipation.  HPI  Past Medical History:  Diagnosis Date  . Breast cyst   . Bronchitis   . Dysmenorrhea   . Fibroids   . Ovarian cyst   . Pyloric ulcer   . Shingles     Patient Active Problem List   Diagnosis Date Noted  . Status post myomectomy 07/16/2015  . Acute pelvic pain, female 05/29/2015  . Fibroids 05/29/2015  . Irregular menstrual cycle 03/20/2012  . Dysmenorrhea 03/20/2012    Past Surgical History:  Procedure Laterality Date  . BREAST LUMPECTOMY Left    benign per patient  . LAPAROSCOPY N/A 07/16/2015   Procedure: LAPAROSCOPY OPERATIVE, Chromopertubation;  Surgeon: Waymon Amato, MD;  Location: [facility];  Service: Gynecology;  Laterality: N/A;  . LAPAROTOMY N/A 07/16/2015   Procedure: LAPAROTOMY, Myomectomy;  Surgeon: Waymon Amato, MD;  Location: [facility];  Service: Gynecology;  Laterality: N/A;  . RECTAL POLYPECTOMY       OB History    Gravida  0   Para      Term      Preterm      AB      Living        SAB      TAB      Ectopic      Multiple      Live Births               Home Medications    Prior to Admission medications   Medication Sig Start Date End Date Taking? Authorizing Provider  metroNIDAZOLE (FLAGYL) 500 MG tablet Take 1 tablet (500 mg total) by mouth 2 (two) times daily. One po bid x 7 days 03/16/17   Molpus, John, MD    Family History Family History  Problem Relation Age of Onset  . Hypertension Father     Social History Social History   Tobacco Use  . Smoking status: Current Every Day Smoker    Packs/day: 0.25    Years: 6.00    Pack years: 1.50    Types: Cigarettes  . Smokeless tobacco: Never Used  Substance Use Topics  . Alcohol use: Yes    Comment: occ  . Drug use: Yes    Types: Marijuana     Allergies   Patient has no known allergies.   Review of Systems Review of Systems  Ten systems reviewed and are negative for acute change, except as noted in the HPI.  Physical Exam Updated Vital Signs BP 112/76   Pulse 87   Temp 98.7 F (37.1 C) (Oral)   Resp 18   Ht 5\' 6"  (1.676 m)   Wt 116.1 kg   LMP 12/12/2017   SpO2 97%   BMI 41.32 kg/m   Physical Exam  Constitutional: She is oriented to person, place, and time. She appears well-developed and well-nourished. No distress.  HENT:  Head: Normocephalic and atraumatic.  Eyes: Conjunctivae are normal. No scleral icterus.  Neck: Normal range of motion.  Cardiovascular: Normal rate, regular rhythm and normal heart sounds. Exam reveals no gallop and no friction rub.  No murmur heard. Pulmonary/Chest: Effort normal and breath sounds normal. No respiratory distress.  Abdominal: Soft. Bowel sounds are normal. She exhibits no distension and no mass. There is no tenderness. There is no guarding.  Genitourinary:  Genitourinary Comments: Pelvic exam: normal external genitalia, vulva, vagina, cervix, uterus and adnexa. Patient has diffuse tenderness throughout the entire exam.   She is tearful.  No CMT specifically or localized tenderness  Neurological: She is alert and oriented to person, place, and time.  Skin: Skin is warm and dry. She is not diaphoretic.  Psychiatric: Her behavior is normal.  Nursing note and vitals reviewed.    ED Treatments / Results  Labs (all labs ordered are listed, but only abnormal results are displayed) Labs Reviewed  URINALYSIS, ROUTINE W REFLEX MICROSCOPIC - Abnormal; Notable for the following components:      Result Value   Specific Gravity, Urine >1.030 (*)    Hgb urine dipstick SMALL (*)    All other components within normal limits  URINALYSIS, MICROSCOPIC (REFLEX) - Abnormal; Notable for the following components:   Bacteria, UA FEW (*)    All other components within normal limits  CBC WITH DIFFERENTIAL/PLATELET - Abnormal; Notable for the following components:   WBC 10.8 (*)    Hemoglobin 11.9 (*)    All other components within normal limits  PREGNANCY, URINE  COMPREHENSIVE METABOLIC PANEL  LIPASE, BLOOD    EKG None  Radiology No results found.  Procedures Procedures (including critical care time)  Medications Ordered in ED Medications  acetaminophen (TYLENOL) tablet 650 mg (650 mg Oral Given 12/30/17 1953)  oxyCODONE-acetaminophen (PERCOCET/ROXICET) 5-325 MG per tablet 2 tablet (2 tablets Oral Given 12/30/17 2129)  ondansetron (ZOFRAN-ODT) disintegrating tablet 4 mg (4 mg Oral Given 12/30/17 2130)  ondansetron (ZOFRAN) injection 4 mg (4 mg Intravenous Given 12/30/17 2221)  fentaNYL (SUBLIMAZE) injection 100 mcg (100 mcg Intravenous Given 12/30/17 2221)  diphenhydrAMINE (BENADRYL) capsule 25 mg (25 mg Oral Given 12/30/17 2253)     Initial Impression / Assessment and Plan / ED Course  I have reviewed the triage vital signs and the nursing notes.  Pertinent labs & imaging results that were available during my care of the patient were reviewed by me and considered in my medical decision making (see chart for  details).    Patient with pelvic pain.  Her labs are negative.  Her ultrasound is negative for acute abnormality including ovarian torsion, tubo-ovarian abscess, or other acute pelvic pathology.  I doubt any other emergent cause of her symptoms.  She has a history of chronic pelvic pain.  Perhaps adhesions from previous surgery.  Patient will be given anti-inflammatory medications and is encouraged to follow-up with her OB/GYN provider this coming Monday.  Her vital signs are stable.  I personally reviewed the images and agree with the radiologic interpretation.  She appears otherwise appropriate  for discharge at this time return precautions discussed.  Final Clinical Impressions(s) / ED Diagnoses   Final diagnoses:  None    ED Discharge Orders    None       Margarita Mail, PA-C 12/31/17 0002    Little, Wenda Overland, MD 12/31/17 1453

## 2017-12-30 NOTE — ED Notes (Signed)
Patient transported to Ultrasound 

## 2017-12-30 NOTE — ED Notes (Signed)
Pt reports itching all over after pain meds given. Harris PA ordered benadryl.

## 2017-12-31 MED ORDER — MELOXICAM 15 MG PO TABS: 15.0000 mg | ORAL_TABLET | Freq: Every day | ORAL | 0 refills | Status: AC

## 2017-12-31 NOTE — Discharge Instructions (Signed)
Contact a health care provider if: Medicine does not help your pain. Your pain comes back. You have new symptoms. You have abnormal vaginal discharge or bleeding, including bleeding after menopause. You have a fever or chills. You are constipated. You have blood in your urine or stool. You have foul-smelling urine. You feel weak or light-headed. Get help right away if: You have sudden severe pain. Your pain gets steadily worse. You have severe pain along with fever, nausea, vomiting, or excessive sweating. You lose consciousness. 

## 2019-10-11 ENCOUNTER — Encounter (HOSPITAL_BASED_OUTPATIENT_CLINIC_OR_DEPARTMENT_OTHER): Payer: Self-pay | Admitting: Emergency Medicine

## 2019-10-11 ENCOUNTER — Emergency Department (HOSPITAL_BASED_OUTPATIENT_CLINIC_OR_DEPARTMENT_OTHER)
Admission: EM | Admit: 2019-10-11 | Discharge: 2019-10-11 | Disposition: A | Discharge status: Home / Self Care | Payer: BLUE CROSS/BLUE SHIELD | Attending: Emergency Medicine | Admitting: Emergency Medicine

## 2019-10-11 ENCOUNTER — Other Ambulatory Visit: Payer: Self-pay

## 2019-10-11 DIAGNOSIS — F17210 Nicotine dependence, cigarettes, uncomplicated: Secondary | ICD-10-CM | POA: Diagnosis not present

## 2019-10-11 DIAGNOSIS — B373 Candidiasis of vulva and vagina: Secondary | ICD-10-CM | POA: Insufficient documentation

## 2019-10-11 DIAGNOSIS — N611 Abscess of the breast and nipple: Secondary | ICD-10-CM | POA: Insufficient documentation

## 2019-10-11 DIAGNOSIS — N898 Other specified noninflammatory disorders of vagina: Secondary | ICD-10-CM | POA: Diagnosis present

## 2019-10-11 DIAGNOSIS — B3731 Acute candidiasis of vulva and vagina: Secondary | ICD-10-CM

## 2019-10-11 LAB — URINALYSIS, MICROSCOPIC (REFLEX)

## 2019-10-11 LAB — WET PREP, GENITAL
Clue Cells Wet Prep HPF POC: NONE SEEN
Sperm: NONE SEEN
Trich, Wet Prep: NONE SEEN
Yeast Wet Prep HPF POC: NONE SEEN

## 2019-10-11 LAB — URINALYSIS, ROUTINE W REFLEX MICROSCOPIC
Bilirubin Urine: NEGATIVE
Glucose, UA: NEGATIVE mg/dL
Hgb urine dipstick: NEGATIVE
Ketones, ur: NEGATIVE mg/dL
Nitrite: NEGATIVE
Protein, ur: NEGATIVE mg/dL
Specific Gravity, Urine: 1.02 (ref 1.005–1.030)
pH: 7 (ref 5.0–8.0)

## 2019-10-11 LAB — PREGNANCY, URINE: Preg Test, Ur: NEGATIVE

## 2019-10-11 MED ORDER — FLUCONAZOLE 150 MG PO TABS
150.0000 mg | ORAL_TABLET | Freq: Once | ORAL | Status: AC
  Administered 2019-10-11: 150 mg via ORAL
  Filled 2019-10-11: qty 1

## 2019-10-11 MED ORDER — SULFAMETHOXAZOLE-TRIMETHOPRIM 800-160 MG PO TABS
1.0000 | ORAL_TABLET | Freq: Once | ORAL | Status: AC
  Administered 2019-10-11: 1 via ORAL
  Filled 2019-10-11: qty 1

## 2019-10-11 MED ORDER — PROBIOTIC & ACIDOPHILUS EX ST PO CAPS: 1.0000 | ORAL_CAPSULE | Freq: Two times a day (BID) | ORAL | 0 refills | Status: AC

## 2019-10-11 MED ORDER — SULFAMETHOXAZOLE-TRIMETHOPRIM 800-160 MG PO TABS: 1.0000 | ORAL_TABLET | Freq: Two times a day (BID) | ORAL | 0 refills | Status: AC

## 2019-10-11 NOTE — ED Provider Notes (Signed)
[facility] Provider Note   CSN: [id number] Arrival date & time: 10/11/19  1452     History Chief Complaint  Patient presents with  . Vaginal Discharge  . Abscess    Laura Vargas is a 29 y.o. female.  Pt presents to the ED today with vaginal d/c and a sore on her right nipple.  Pt used Boric Acid vaginal suppositories which did seem to help.  Pt denies any vaginal bleeding.          Past Medical History:  Diagnosis Date  . Breast cyst   . Bronchitis   . Dysmenorrhea   . Fibroids   . Ovarian cyst   . Pyloric ulcer   . Shingles     Patient Active Problem List   Diagnosis Date Noted  . Status post myomectomy 07/16/2015  . Acute pelvic pain, female 05/29/2015  . Fibroids 05/29/2015  . Irregular menstrual cycle 03/20/2012  . Dysmenorrhea 03/20/2012    Past Surgical History:  Procedure Laterality Date  . BREAST LUMPECTOMY Left    benign per patient  . LAPAROSCOPY N/A 07/16/2015   Procedure: LAPAROSCOPY OPERATIVE, Chromopertubation;  Surgeon: Waymon Amato, MD;  Location: [facility];  Service: Gynecology;  Laterality: N/A;  . LAPAROTOMY N/A 07/16/2015   Procedure: LAPAROTOMY, Myomectomy;  Surgeon: Waymon Amato, MD;  Location: [facility];  Service: Gynecology;  Laterality: N/A;  . RECTAL POLYPECTOMY       OB History    Gravida  0   Para      Term      Preterm      AB      Living        SAB      TAB      Ectopic      Multiple      Live Births              Family History  Problem Relation Age of Onset  . Hypertension Father     Social History   Tobacco Use  . Smoking status: Current Every Day Smoker    Packs/day: 0.25    Years: 6.00    Pack years: 1.50    Types: Cigarettes  . Smokeless tobacco: Never Used  Substance Use Topics  . Alcohol use: Yes    Comment: occ  . Drug use: Yes    Types: Marijuana    Home Medications Prior to Admission medications   Medication Sig Start Date End Date Taking?  Authorizing Provider  meloxicam (MOBIC) 15 MG tablet Take 1 tablet (15 mg total) by mouth daily. 12/30/17   Harris, Vernie Shanks, PA-C  metroNIDAZOLE (FLAGYL) 500 MG tablet Take 1 tablet (500 mg total) by mouth 2 (two) times daily. One po bid x 7 days 03/16/17   Molpus, John, MD  Probiotic Product (PROBIOTIC & ACIDOPHILUS EX ST) CAPS Take 1 capsule by mouth 2 (two) times daily. 10/11/19   Isla Pence, MD  sulfamethoxazole-trimethoprim (BACTRIM DS) 800-160 MG tablet Take 1 tablet by mouth 2 (two) times daily for 7 days. 10/11/19 10/18/19  Isla Pence, MD    Allergies    Patient has no known allergies.  Review of Systems   Review of Systems  Genitourinary: Positive for vaginal discharge.  Skin: Positive for wound.  All other systems reviewed and are negative.   Physical Exam Updated Vital Signs BP 129/83   Pulse 90   Temp 98.3 F (36.8 C)   Resp 16   Ht 5'  5" (1.651 m)   Wt 115.2 kg   LMP 09/09/2019   SpO2 100%   BMI 42.27 kg/m   Physical Exam Vitals and nursing note reviewed. Exam conducted with a chaperone present.  Constitutional:      Appearance: Normal appearance.  HENT:     Head: Normocephalic and atraumatic.     Right Ear: External ear normal.     Left Ear: External ear normal.     Nose: Nose normal.     Mouth/Throat:     Mouth: Mucous membranes are moist.     Pharynx: Oropharynx is clear.  Eyes:     Extraocular Movements: Extraocular movements intact.     Conjunctiva/sclera: Conjunctivae normal.     Pupils: Pupils are equal, round, and reactive to light.  Cardiovascular:     Rate and Rhythm: Normal rate and regular rhythm.     Pulses: Normal pulses.     Heart sounds: Normal heart sounds.  Pulmonary:     Effort: Pulmonary effort is normal.     Breath sounds: Normal breath sounds.  Chest:     Comments: Very tiny abscess to right nipple.   Abdominal:     General: Abdomen is flat. Bowel sounds are normal.     Palpations: Abdomen is soft.  Genitourinary:     Exam position: Lithotomy position.     Vagina: Vaginal discharge present.     Uterus: Normal.      Adnexa: Right adnexa normal and left adnexa normal.  Musculoskeletal:        General: Normal range of motion.     Cervical back: Normal range of motion and neck supple.  Skin:    General: Skin is warm.     Capillary Refill: Capillary refill takes less than 2 seconds.  Neurological:     Mental Status: She is alert.     ED Results / Procedures / Treatments   Labs (all labs ordered are listed, but only abnormal results are displayed) Labs Reviewed  WET PREP, GENITAL - Abnormal; Notable for the following components:      Result Value   WBC, Wet Prep HPF POC FEW (*)    All other components within normal limits  URINALYSIS, ROUTINE W REFLEX MICROSCOPIC - Abnormal; Notable for the following components:   Leukocytes,Ua TRACE (*)    All other components within normal limits  URINALYSIS, MICROSCOPIC (REFLEX) - Abnormal; Notable for the following components:   Bacteria, UA RARE (*)    All other components within normal limits  PREGNANCY, URINE  GC/CHLAMYDIA PROBE AMP ([place]) NOT AT [facility]    EKG None  Radiology No results found.  Procedures Procedures (including critical care time)  Medications Ordered in ED Medications  fluconazole (DIFLUCAN) tablet 150 mg (has no administration in time range)  sulfamethoxazole-trimethoprim (BACTRIM DS) 800-160 MG per tablet 1 tablet (has no administration in time range)    ED Course  I have reviewed the triage vital signs and the nursing notes.  Pertinent labs & imaging results that were available during my care of the patient were reviewed by me and considered in my medical decision making (see chart for details).    MDM Rules/Calculators/A&P                          Pt does have some whitish d/c that looks like yeast.  Swab neg, but I will treat with diflucan.  Breast abscess does not need I&D.  I  will start oral abx.  Due to the  possible yeast, I will also put her on probiotics.  F/u with obgyn.  Return if worse.   Final Clinical Impression(s) / ED Diagnoses Final diagnoses:  Abscess of nipple, right  Vaginal candidiasis    Rx / DC Orders ED Discharge Orders         Ordered    sulfamethoxazole-trimethoprim (BACTRIM DS) 800-160 MG tablet  2 times daily     Discontinue  Reprint     10/11/19 1619    Probiotic Product (PROBIOTIC & ACIDOPHILUS EX ST) CAPS  2 times daily     Discontinue  Reprint     10/11/19 1619           Isla Pence, MD 10/11/19 1620

## 2019-10-11 NOTE — ED Triage Notes (Signed)
Vaginal discharge x 1 week. She self treated for a yeast infection, states the discharge has lightened up but she still has vaginal discomfort. Also reports abscess to R nipple

## 2019-10-12 LAB — GC/CHLAMYDIA PROBE AMP (~~LOC~~) NOT AT ARMC
Chlamydia: NEGATIVE
Comment: NEGATIVE
Comment: NORMAL
Neisseria Gonorrhea: NEGATIVE

## 2020-08-27 IMAGING — US US ART/VEN ABD/PELV/SCROTUM DOPPLER LTD
1 series · 13 of 25 positions shown · non-contrast
Comparison: 11/28/2017

CLINICAL DATA: Mid pelvic pain x2 weeks.  History of myomectomy.

EXAM:
TRANSABDOMINAL AND TRANSVAGINAL ULTRASOUND OF PELVIS
DOPPLER ULTRASOUND OF OVARIES
TECHNIQUE: Both transabdominal and transvaginal ultrasound examinations of the
pelvis were performed. Transabdominal technique was performed for
global imaging of the pelvis including uterus, ovaries, adnexal
regions, and pelvic cul-de-sac.
It was necessary to proceed with endovaginal exam following the
transabdominal exam to visualize the uterus, endometrium and
ovaries. Color and duplex Doppler ultrasound was utilized to
evaluate blood flow to the ovaries.

[Series 1: us art/ven abd/pelv/scrotum doppler ltd · 0.25mm/px · 13 of 54 slices shown]
[im 1/54]
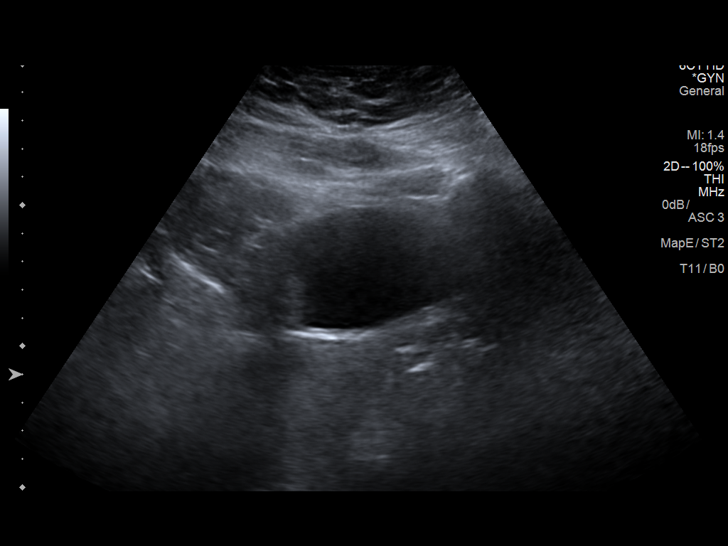
[im 5/54]
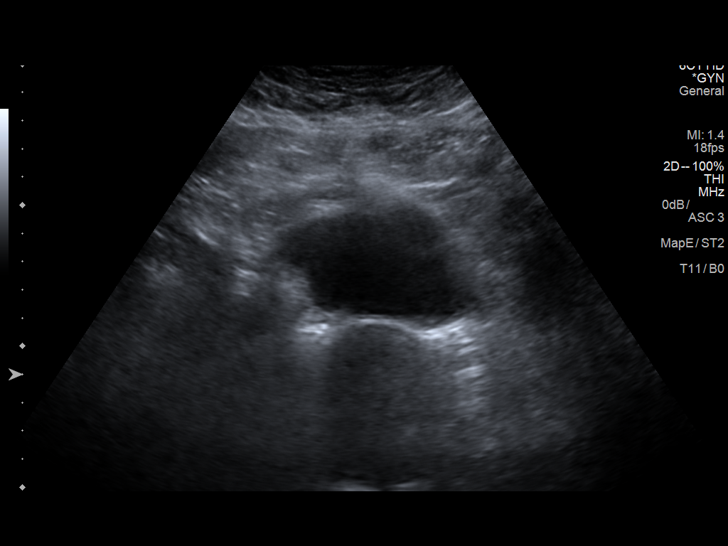
[im 9/54]
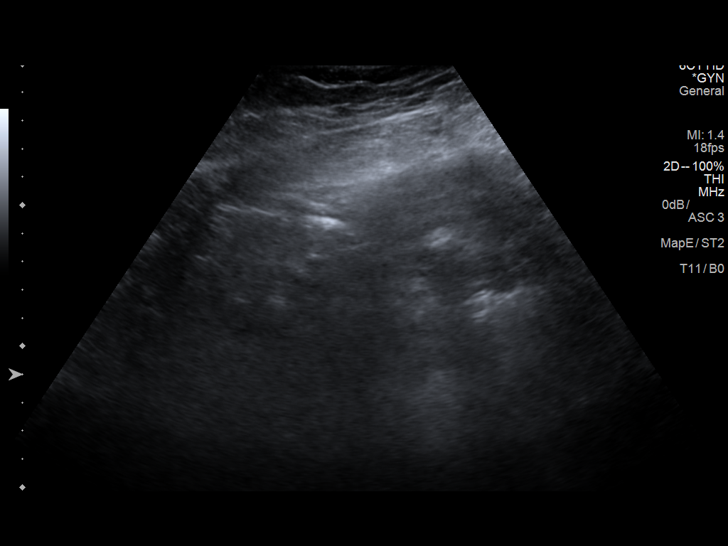
[im 14/54]
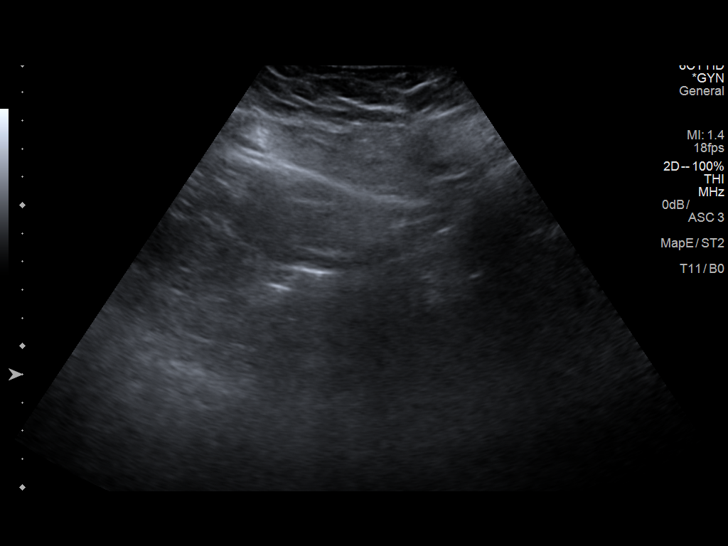
[im 18/54]
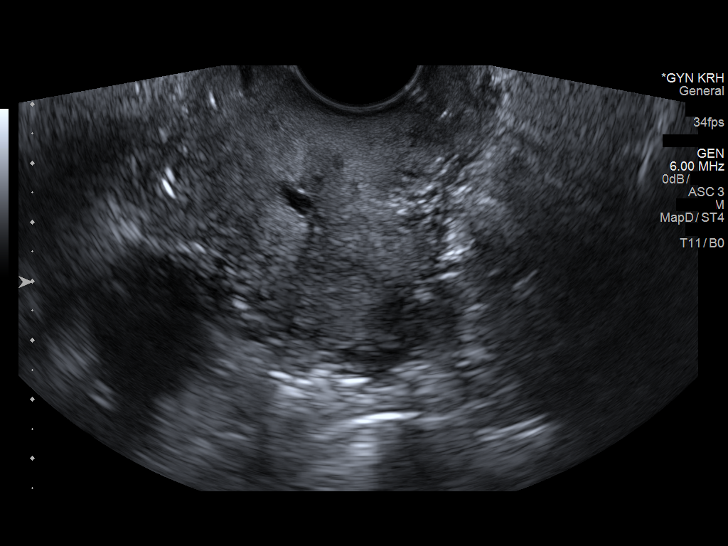
[im 23/54]
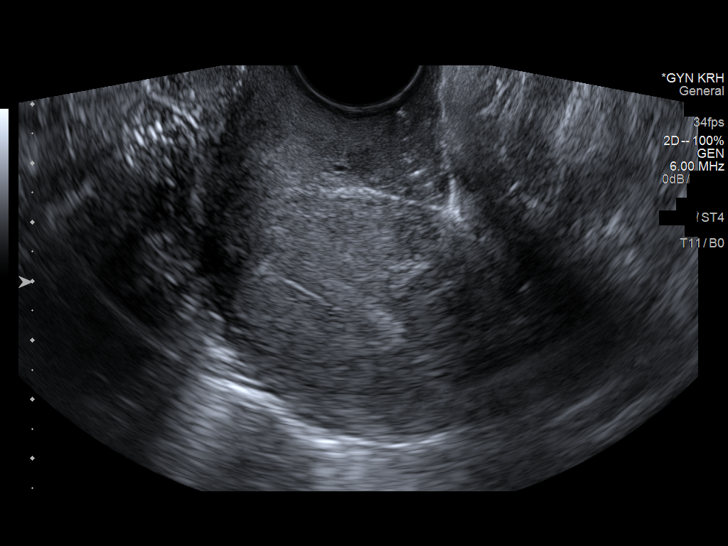
[im 27/54]
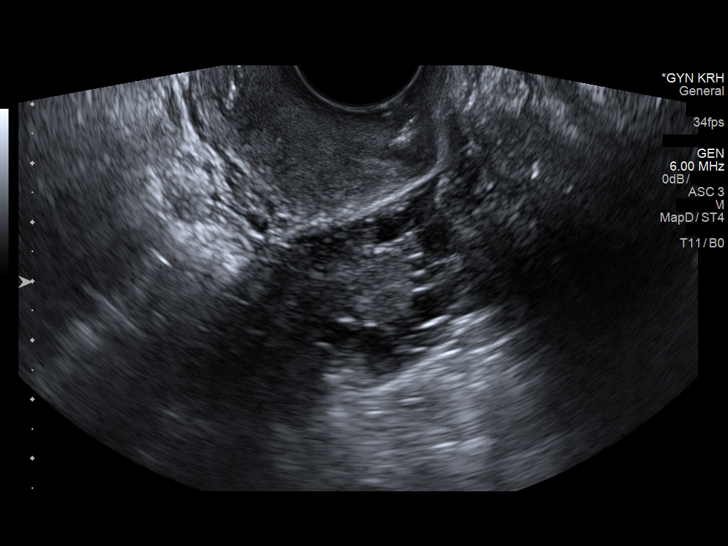
[im 31/54]
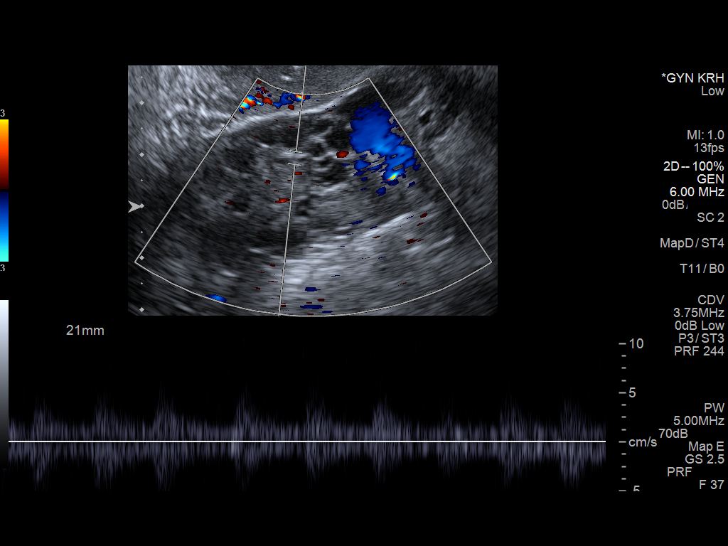
[im 36/54]
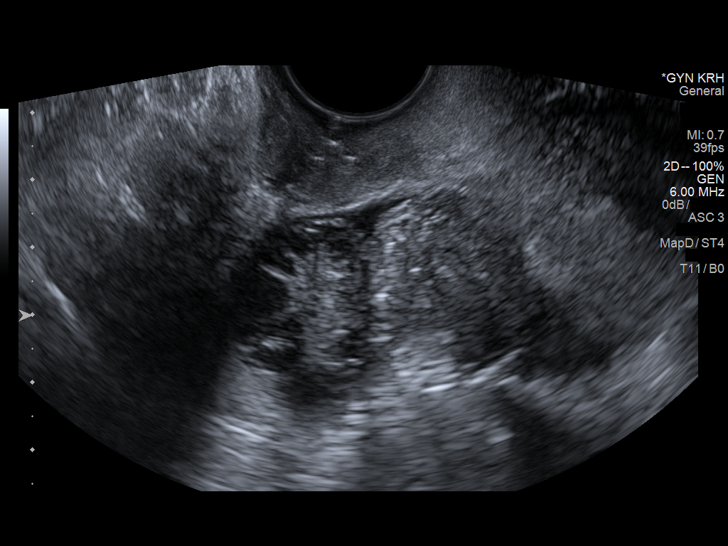
[im 40/54]
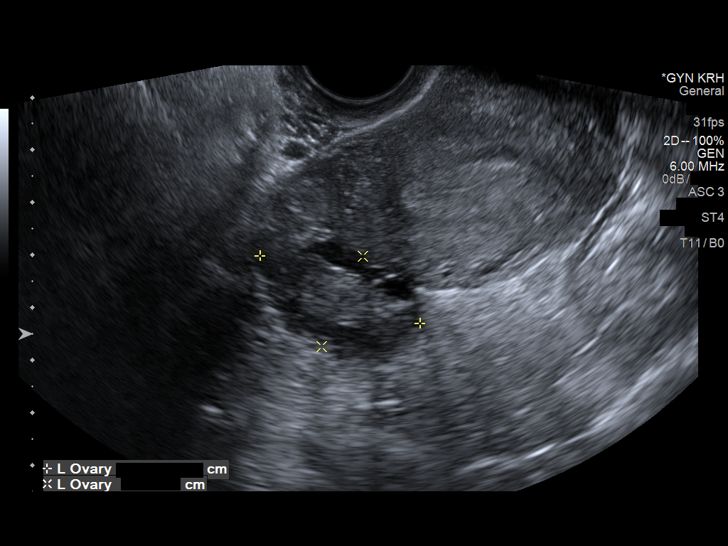
[im 45/54]
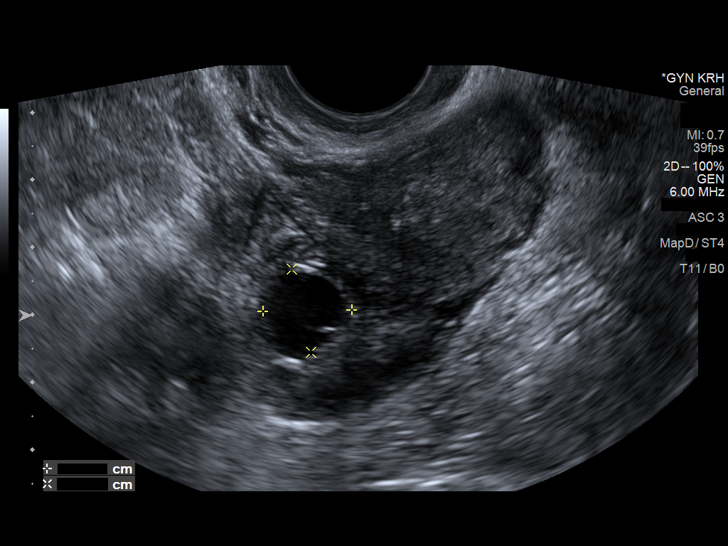
[im 49/54]
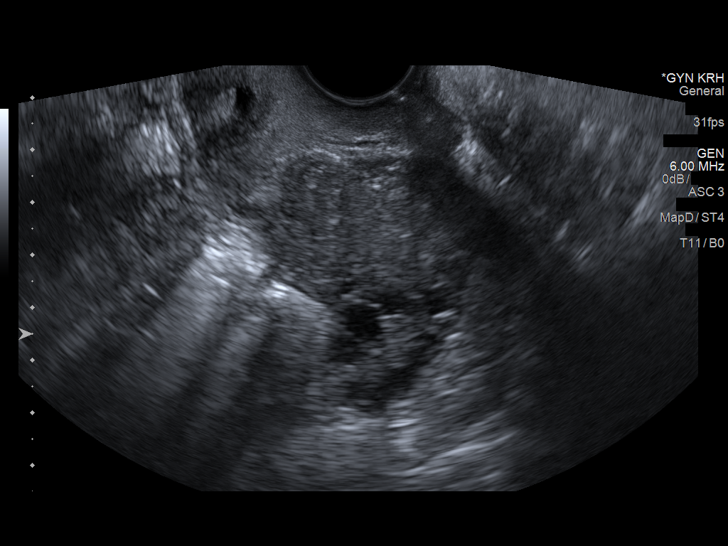
[im 54/54]
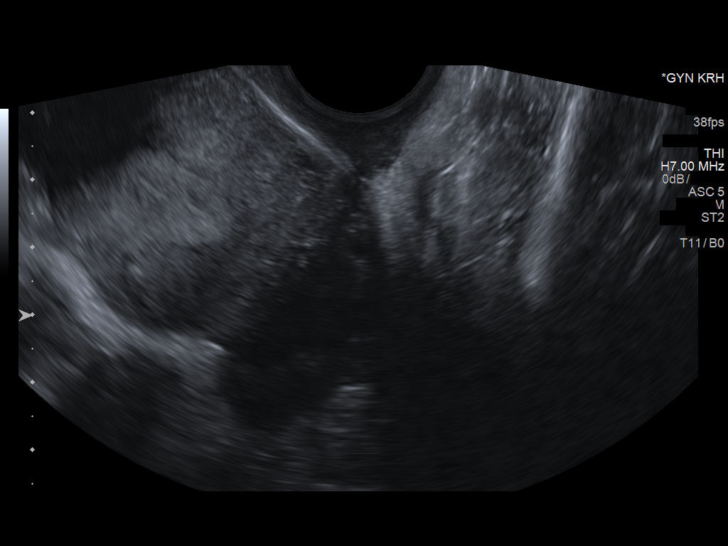

[13 of 25 positions shown; findings below may reference images not displayed]

FINDINGS: Uterus

Measurements: 6.9 x 4.2 x 6.1 cm. The uterus is retroverted. No
fibroids or other mass visualized.

Endometrium

Thickness: 9 mm in thickness and homogeneous in appearance. No focal
abnormality visualized.

Right ovary

Measurements: 3.8 x 3 x 2.2 cm. Small peripheral follicles are
redemonstrated.

Left ovary

Measurements: 3.3 x 1.9 x 2.6 cm. Small peripheral follicles are
redemonstrated.

Pulsed Doppler evaluation of both ovaries demonstrates normal
low-resistance arterial and venous waveforms. No torsion.

Other findings

No abnormal free fluid.
IMPRESSION: Small bilateral peripheral follicles within both ovaries suspicious
for polycystic ovarian syndrome. No uterine mass. No torsion.

## 2020-12-21 ENCOUNTER — Other Ambulatory Visit: Payer: Self-pay

## 2020-12-21 ENCOUNTER — Emergency Department (HOSPITAL_BASED_OUTPATIENT_CLINIC_OR_DEPARTMENT_OTHER): Payer: BLUE CROSS/BLUE SHIELD

## 2020-12-21 ENCOUNTER — Emergency Department (HOSPITAL_BASED_OUTPATIENT_CLINIC_OR_DEPARTMENT_OTHER)
Admission: EM | Admit: 2020-12-21 | Discharge: 2020-12-21 | Disposition: A | Discharge status: Home / Self Care | Payer: BLUE CROSS/BLUE SHIELD | Attending: Emergency Medicine | Admitting: Emergency Medicine

## 2020-12-21 ENCOUNTER — Encounter (HOSPITAL_BASED_OUTPATIENT_CLINIC_OR_DEPARTMENT_OTHER): Payer: Self-pay | Admitting: Emergency Medicine

## 2020-12-21 DIAGNOSIS — M7989 Other specified soft tissue disorders: Secondary | ICD-10-CM | POA: Insufficient documentation

## 2020-12-21 DIAGNOSIS — S0012XA Contusion of left eyelid and periocular area, initial encounter: Secondary | ICD-10-CM | POA: Insufficient documentation

## 2020-12-21 DIAGNOSIS — R220 Localized swelling, mass and lump, head: Secondary | ICD-10-CM | POA: Diagnosis not present

## 2020-12-21 DIAGNOSIS — R109 Unspecified abdominal pain: Secondary | ICD-10-CM | POA: Diagnosis not present

## 2020-12-21 DIAGNOSIS — T7411XA Adult physical abuse, confirmed, initial encounter: Secondary | ICD-10-CM

## 2020-12-21 DIAGNOSIS — S0592XA Unspecified injury of left eye and orbit, initial encounter: Secondary | ICD-10-CM | POA: Diagnosis present

## 2020-12-21 DIAGNOSIS — F17210 Nicotine dependence, cigarettes, uncomplicated: Secondary | ICD-10-CM | POA: Insufficient documentation

## 2020-12-21 DIAGNOSIS — M542 Cervicalgia: Secondary | ICD-10-CM | POA: Diagnosis not present

## 2020-12-21 LAB — CBC WITH DIFFERENTIAL/PLATELET
Abs Immature Granulocytes: 0.03 10*3/uL (ref 0.00–0.07)
Basophils Absolute: 0 10*3/uL (ref 0.0–0.1)
Basophils Relative: 0 %
Eosinophils Absolute: 0.1 10*3/uL (ref 0.0–0.5)
Eosinophils Relative: 0 %
HCT: 32.5 % — ABNORMAL LOW (ref 36.0–46.0)
Hemoglobin: 10.5 g/dL — ABNORMAL LOW (ref 12.0–15.0)
Immature Granulocytes: 0 %
Lymphocytes Relative: 16 %
Lymphs Abs: 2 10*3/uL (ref 0.7–4.0)
MCH: 26.9 pg (ref 26.0–34.0)
MCHC: 32.3 g/dL (ref 30.0–36.0)
MCV: 83.3 fL (ref 80.0–100.0)
Monocytes Absolute: 0.6 10*3/uL (ref 0.1–1.0)
Monocytes Relative: 5 %
Neutro Abs: 9.4 10*3/uL — ABNORMAL HIGH (ref 1.7–7.7)
Neutrophils Relative %: 79 %
Platelets: 357 10*3/uL (ref 150–400)
RBC: 3.9 MIL/uL (ref 3.87–5.11)
RDW: 14.4 % (ref 11.5–15.5)
WBC: 12.1 10*3/uL — ABNORMAL HIGH (ref 4.0–10.5)
nRBC: 0 % (ref 0.0–0.2)

## 2020-12-21 LAB — COMPREHENSIVE METABOLIC PANEL
ALT: 15 U/L (ref 0–44)
AST: 17 U/L (ref 15–41)
Albumin: 4.1 g/dL (ref 3.5–5.0)
Alkaline Phosphatase: 57 U/L (ref 38–126)
Anion gap: 8 (ref 5–15)
BUN: 12 mg/dL (ref 6–20)
CO2: 25 mmol/L (ref 22–32)
Calcium: 9.1 mg/dL (ref 8.9–10.3)
Chloride: 104 mmol/L (ref 98–111)
Creatinine, Ser: 0.96 mg/dL (ref 0.44–1.00)
GFR, Estimated: >60 mL/min (ref >60)
Glucose, Bld: 104 mg/dL — ABNORMAL HIGH (ref 70–99)
Potassium: 3.8 mmol/L (ref 3.5–5.1)
Sodium: 137 mmol/L (ref 135–145)
Total Bilirubin: 0.1 mg/dL — ABNORMAL LOW (ref 0.3–1.2)
Total Protein: 7.3 g/dL (ref 6.5–8.1)

## 2020-12-21 LAB — PREGNANCY, URINE: Preg Test, Ur: NEGATIVE

## 2020-12-21 MED ORDER — ONDANSETRON HCL 4 MG PO TABS: 4.0000 mg | ORAL_TABLET | Freq: Four times a day (QID) | ORAL | 0 refills | Status: AC

## 2020-12-21 MED ORDER — HYDROMORPHONE HCL 1 MG/ML IJ SOLN
1.0000 mg | INTRAMUSCULAR | Status: DC | PRN
Start: 2020-12-21 — End: 2020-12-21

## 2020-12-21 MED ORDER — SODIUM CHLORIDE 0.9 % IV BOLUS
1000.0000 mL | Freq: Once | INTRAVENOUS | Status: AC
  Administered 2020-12-21: 1000 mL via INTRAVENOUS

## 2020-12-21 MED ORDER — MORPHINE SULFATE (PF) 4 MG/ML IV SOLN
4.0000 mg | Freq: Once | INTRAVENOUS | Status: AC
  Administered 2020-12-21: 4 mg via INTRAVENOUS
  Filled 2020-12-21: qty 1

## 2020-12-21 MED ORDER — OXYCODONE-ACETAMINOPHEN 5-325 MG PO TABS: 1.0000 | ORAL_TABLET | Freq: Four times a day (QID) | ORAL | 0 refills | Status: AC | PRN

## 2020-12-21 MED ORDER — ONDANSETRON HCL 4 MG/2ML IJ SOLN
4.0000 mg | Freq: Four times a day (QID) | INTRAMUSCULAR | Status: DC | PRN
  Administered 2020-12-21: 4 mg via INTRAVENOUS
  Filled 2020-12-21: qty 2

## 2020-12-21 MED ORDER — IOHEXOL 350 MG/ML SOLN
100.0000 mL | Freq: Once | INTRAVENOUS | Status: AC | PRN
  Administered 2020-12-21: 100 mL via INTRAVENOUS

## 2020-12-21 MED ORDER — HYDROMORPHONE HCL 1 MG/ML IJ SOLN
1.0000 mg | Freq: Once | INTRAMUSCULAR | Status: AC
Start: 2020-12-21 — End: 2020-12-21
  Administered 2020-12-21: 1 mg via INTRAVENOUS
  Filled 2020-12-21: qty 1

## 2020-12-21 MED ORDER — OXYCODONE-ACETAMINOPHEN 5-325 MG PO TABS
2.0000 | ORAL_TABLET | Freq: Once | ORAL | Status: DC
  Filled 2020-12-21: qty 2
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
## 2020-12-21 NOTE — ED Notes (Signed)
ED Provider at bedside. 

## 2020-12-21 NOTE — ED Notes (Signed)
Patient transported to CT 
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
## 2020-12-21 NOTE — Discharge Instructions (Addendum)
Your scans are reassuring in the ED today.  They show no fracture or dislocation in your head or left shoulder.  No bleeding or abnormalities noted in your belly.  You may use ice on your facial swelling which hopefully will go down over the next few days.  Ibuprofen also may help with the swelling.  I am sending you with 5 days worth of Percocet to the pharmacy.  Make sure not to take Tylenol with this medication.  Absolutely do not take the oxycontin that you have a family friend.  You are able to take Advil and Percocet together.  You may become constipated because of the Percocet for which she may take MiraLAX if needed.  If you experience itching on the Percocet you may also utilize Benadryl to treat the symptoms.  Do not drive or operate machinery on your Percocet and do not share this prescription with anybody else.  Please do not go horseback riding tomorrow however you may return next Monday if you feel able.  Return to the emergency department if you start to experience quick onset headaches, changes in your vision, difficulty breathing, chest pain or your symptoms worsen.

## 2021-01-08 ENCOUNTER — Emergency Department (HOSPITAL_BASED_OUTPATIENT_CLINIC_OR_DEPARTMENT_OTHER)
Admission: EM | Admit: 2021-01-08 | Discharge: 2021-01-09 | Disposition: A | Discharge status: Home / Self Care | Payer: BLUE CROSS/BLUE SHIELD | Attending: Emergency Medicine | Admitting: Emergency Medicine

## 2021-01-08 ENCOUNTER — Other Ambulatory Visit: Payer: Self-pay

## 2021-01-08 ENCOUNTER — Encounter (HOSPITAL_BASED_OUTPATIENT_CLINIC_OR_DEPARTMENT_OTHER): Payer: Self-pay | Admitting: *Deleted

## 2021-01-08 DIAGNOSIS — R519 Headache, unspecified: Secondary | ICD-10-CM | POA: Insufficient documentation

## 2021-01-08 DIAGNOSIS — H53149 Visual discomfort, unspecified: Secondary | ICD-10-CM | POA: Insufficient documentation

## 2021-01-08 DIAGNOSIS — R110 Nausea: Secondary | ICD-10-CM | POA: Insufficient documentation

## 2021-01-08 DIAGNOSIS — F17210 Nicotine dependence, cigarettes, uncomplicated: Secondary | ICD-10-CM | POA: Diagnosis not present
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
## 2021-01-08 NOTE — ED Triage Notes (Signed)
Head pressure that makes her dizzy x 3 weeks. She was seen in a hospital in [place]. The medications they gave her made her have a panic attack. She appears anxious at triage.
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
## 2021-01-09 MED ORDER — DROPERIDOL 2.5 MG/ML IJ SOLN
2.5000 mg | Freq: Once | INTRAMUSCULAR | Status: AC
  Administered 2021-01-09: 2.5 mg via INTRAVENOUS
  Filled 2021-01-09: qty 2

## 2021-01-09 MED ORDER — KETOROLAC TROMETHAMINE 15 MG/ML IJ SOLN
15.0000 mg | Freq: Once | INTRAMUSCULAR | Status: AC
  Administered 2021-01-09: 15 mg via INTRAVENOUS
  Filled 2021-01-09: qty 1
